# Patient Record
Sex: Female | Born: 1935 | Race: Black or African American | Hispanic: No | Marital: Married | Smoking: Never smoker
Health system: Southern US, Community
[De-identification: ages and names within clinical notes are randomized; demographics above are authoritative.]

## PROBLEM LIST (undated history)

## (undated) DIAGNOSIS — F0390 Unspecified dementia without behavioral disturbance: Secondary | ICD-10-CM

## (undated) DIAGNOSIS — E119 Type 2 diabetes mellitus without complications: Secondary | ICD-10-CM

## (undated) DIAGNOSIS — I509 Heart failure, unspecified: Secondary | ICD-10-CM

## (undated) DIAGNOSIS — I10 Essential (primary) hypertension: Secondary | ICD-10-CM

---

## 2005-01-06 ENCOUNTER — Emergency Department (HOSPITAL_COMMUNITY): Admission: EM | Admit: 2005-01-06 | Discharge: 2005-01-06 | Payer: Self-pay | Admitting: Emergency Medicine

## 2007-02-06 ENCOUNTER — Ambulatory Visit: Payer: Self-pay | Admitting: Physical Medicine & Rehabilitation

## 2007-02-06 ENCOUNTER — Encounter
Admission: RE | Admit: 2007-02-06 | Discharge: 2007-05-07 | Payer: Self-pay | Admitting: Physical Medicine & Rehabilitation

## 2007-03-29 ENCOUNTER — Emergency Department (HOSPITAL_COMMUNITY): Admission: EM | Admit: 2007-03-29 | Discharge: 2007-03-30 | Payer: Self-pay | Admitting: Emergency Medicine

## 2007-05-10 ENCOUNTER — Ambulatory Visit: Payer: Self-pay | Admitting: Physical Medicine & Rehabilitation

## 2007-05-12 ENCOUNTER — Encounter
Admission: RE | Admit: 2007-05-12 | Discharge: 2007-08-10 | Payer: Self-pay | Admitting: Physical Medicine & Rehabilitation

## 2007-09-05 ENCOUNTER — Encounter
Admission: RE | Admit: 2007-09-05 | Discharge: 2007-09-06 | Payer: Self-pay | Admitting: Physical Medicine & Rehabilitation

## 2007-09-05 ENCOUNTER — Ambulatory Visit: Payer: Self-pay | Admitting: Physical Medicine & Rehabilitation

## 2008-01-02 ENCOUNTER — Encounter
Admission: RE | Admit: 2008-01-02 | Discharge: 2008-03-11 | Payer: Self-pay | Admitting: Physical Medicine & Rehabilitation

## 2008-01-02 ENCOUNTER — Ambulatory Visit: Payer: Self-pay | Admitting: Physical Medicine & Rehabilitation

## 2008-03-25 ENCOUNTER — Encounter
Admission: RE | Admit: 2008-03-25 | Discharge: 2008-03-25 | Payer: Self-pay | Admitting: Physical Medicine & Rehabilitation

## 2008-06-06 ENCOUNTER — Encounter
Admission: RE | Admit: 2008-06-06 | Discharge: 2008-06-07 | Payer: Self-pay | Admitting: Physical Medicine & Rehabilitation

## 2008-06-07 ENCOUNTER — Ambulatory Visit: Payer: Self-pay | Admitting: Physical Medicine & Rehabilitation

## 2008-09-24 ENCOUNTER — Encounter
Admission: RE | Admit: 2008-09-24 | Discharge: 2008-09-24 | Payer: Self-pay | Admitting: Physical Medicine & Rehabilitation

## 2011-05-11 NOTE — Assessment & Plan Note (Signed)
SUBJECTIVE:  The patient returns to clinic today for followup evaluation  accompanied by her daughter. Overall, the patient reports that she is  doing fairly well. She has had a recent refill on her Percocet which she  uses generally 4 tablets per day. She does need a refill on her fentanyl  patch. We have increased her probably a few months ago up to the 75  mcg/h from the 50 mcg. She reports that overall she is getting a fair  amount of relief. She is mostly wheelchair dependent although she does  occasionally use a walker insider her home.   MEDICATIONS:  1. Naproxen 250 mg b.i.d.  2. Prednisone 10 mg.  3. Hydralazine 10 mg t.i.d.  4. Oxybutynin 10 mg.  5. Fosinopril 40 mg 2 tablets.  6. Glimepiride 2 mg daily.  7. Lorazepam 1 mg b.i.d.  8. Aspirin 325 mg daily.  9. Neurontin 300 mg 2 tablets daily.  10.Fentanyl patch 75 mcg/h, change q. 72 h.  11.Lipitor 40 mg 2 tablets daily.  12.Stool softener daily.  13.Percocet 5/325 1 tablet q.i.d. p.r.n.  14.Antidepressant medication daily.   REVIEW OF SYSTEMS:  Positive for fever and chills, night sweats, high  blood sugar and limb swelling.   PHYSICAL EXAMINATION:  GENERAL APPEARANCE: Reasonably well-appearing,  overweight adult female seated in a manual wheelchair.  VITAL SIGNS: Blood pressure is 112/67 with a pulse of 73, respiratory  rate 18 and 02 saturation 98% on room air.  EXTREMITIES: She has 4-/5 strength throughout the bilateral upper and  lower extremities.   IMPRESSION:  1. Diffuse musculoskeletal pain related to osteoarthritis with      evidence of severe degenerative changes of the bilateral shoulders.  2. In the office today we did refill the patient's  fentanyl patch at      75 mcg/h strength. No refill on other medicines is necessary in the      office today. She recently had a refill at the end of August for      the Percocet.  3. Will plan on seeing her in followup in approximately 3 to 4 months      with  refills prior to that appointment as necessary.           ______________________________  Ellwood Dense, M.D.     DC/MedQ  D:  09/06/2007 10:27:44  T:  09/06/2007 20:23:00  Job #:  [id number]

## 2011-05-11 NOTE — Assessment & Plan Note (Signed)
Ms. Appleman returns to clinic today for followup evaluation.  I last saw  her in this office in January 03, 2008.  Subsequent to that, she did not  keep her appointment on March 27, 2008.  She apparently is out of her  fentanyl patch at this time and no call for refill was made.  She just  is running out of her morphine sulfate immediate release as of today.  She does need help remembering to call in for refills and I have  expressed that to her niece in the office today.   MEDICATIONS:  1. Naproxen 250 mg b.i.d.  2. Prednisone 10 mg daily.  3. Hydralazine 10 mg t.i.d.  4. Oxybutynin 10 mg daily.  5. Lisinopril 40 mg 2 tablets daily.  6. Glimepiride 2 mg daily.  7. Clonazepam 1 mg b.i.d.  8. Aspirin 325 mg daily.  9. Neurontin 300 mg 2 tablets daily.  10.Fentanyl patch 100 mcg per hour, change q.72 h.  11.Lipitor 40 mg 2 tablets daily.  12.Stool softener daily.  13.Morphine sulfate immediate release 15 mg b.i.d.  14.Antidepressant medication daily.   REVIEW OF SYSTEMS:  Positive for night sweats, skin rash, high blood  sugar, constipation, nausea, painful urination, coughing, wheezing, and  shortness of breath.   PHYSICAL EXAMINATION:  Ill-kempt overweight adult female, seated in a  manual wheelchair.  Blood pressure is 139/51 with a pulse 76, respiratory rate 20, and O2  saturation 95% on room air.  She is 4-/5 strength throughout the bilateral upper and lower  extremities.   IMPRESSION:  Diffuse musculoskeletal pain related to osteoarthritis with  evidence of severe degenerative changes over the bilateral shoulders.   In the office today, we did refill the patient's fentanyl patch at 100  mcg per hour, change q.72 h.  We have also refilled her morphine sulfate  at a b.i.d. dosing on an as needed basis.  We plan on seeing her to  follow up in approximately 3 months' time.  I have encouraged her niece  to try to help her keep control of her medication and call in for  refills  as necessary and appropriate.           ______________________________  Ellwood Dense, M.D.     DC/MedQ  D:  06/07/2008 11:03:41  T:  06/08/2008 01:03:09  Job #:  [id number]

## 2011-05-11 NOTE — Assessment & Plan Note (Signed)
FOLLOWUP OFFICE NOTE:  Ms. Sobieski returned to clinic today accompanied  by her daughter.  Patient reportedly was hospitalized for depression and  started on antidepressant medication.  The daughter feels that that has  helped substantially with her overall mood and may even have helped with  her pain.  She is using a Fentanyl patch applied by a family member.  She also uses Percocet.  During her last clinic visit we had increased  her Fentanyl up to 75 mcg patch per hour from a 50 mcg patch.  She also  was increased for the Percocet from t.i.d. to q.i.d. dosing.  Both seem  to have been changes that have helped her.   MEDICATIONS:  1. Naproxen 250 mg b.i.d.  2. Prednisone 10 mg daily.  3. Hydralazine 10 mg t.i.d.  4. Oxybutynin 10 mg daily.  5. Fosinopril 40 mg two tablets a day.  6. Glimepiride 2 mg daily.  7. Lorazepam 1 mg b.i.d.  8. Aspirin 325 mg daily.  9. Neurontin 300 mg four tablets q.i.d.  10.Fentanyl patch 75 mcg per hour, change q.72h.  11.Lipitor 40 mg two tablets daily.  12.Stool softener daily.  13.Percocet 5/325 one tablet q.i.d. p.r.n.  14.Antidepressant medication daily.   REVIEW OF SYSTEMS:  Noncontributory.   PHYSICAL EXAMINATION:  GENERAL:  Well-appearing, overweight adult female  sitting in a manual wheelchair.  VITAL SIGNS:  Blood pressure 141/76, pulse 73, respiratory rate 16, O2  saturation 99% on room air.  She has 4-/5 strength throughout the  bilateral upper and lower extremities.   IMPRESSION:  1. Diffuse musculoskeletal pain related to osteoarthritis with      evidence of severe degenerative changes of bilateral shoulders.   In the office today we did refill the patient's Percocet and Fentanyl  patch as noted above.  Will plan on seeing her in followup in  approximately four month's time with refills prior to that appointment  if necessary.  She continues to do well overall and is experiencing good  analgesic effect from the medication without  significant side effects.           ______________________________  Ellwood Dense, M.D.     DC/MedQ  D:  05/12/2007 11:20:14  T:  05/12/2007 12:28:49  Job #:  [id number]

## 2011-05-11 NOTE — Assessment & Plan Note (Signed)
Ms. Powell returns to the clinic today accompanied by her niece.  Overall, she is reporting that she is still not getting adequate relief  from the Fentanyl patch 75 mcg per hour change q.72h.  She reports that  she also uses Percocet 1 tablet 4 times a day and wants to try something  else in place of the Percocet, as she still feels that she is not  getting adequate relief.  She still complains of pain that is worse in  her shoulders, arms, and hands.   MEDICATIONS:  1. Naproxen 250 mg b.i.d.  2. Prednisone 10 mg daily.  3. Hydralazine 10 mg t.i.d.  4. Oxybutynin 10 mg daily.  5. Fonisopril 40 mg 2 tablets daily.  6. Glimepiride 2 mg daily.  7. Clonazepam 1 mg b.i.d.  8. Aspirin 325 mg daily.  9. Neurontin 300 mg 2 tablets daily.  10.Fentanyl patch 75 mcg per hour changed q.72h.  11.Lipitor 40 mg 2 tablets daily.  12.Stool softener daily.  13.Percocet 5/325 one tablet q.i.d. p.r.n. (4 per day).  14.Antidepressant medication daily.   REVIEW OF SYSTEMS:  Positive for shortness of breath, limb swelling,  poor appetite, abdominal pain, diarrhea, constipation, vomiting, nausea,  high blood sugars, night sweats, fevers, and chills.   PHYSICAL EXAM:  Reasonably well-appearing overweight adult female seated  in a manual wheelchair.  Blood pressure 143/59 with a pulse of 80, respiratory rate 20, and O2  saturation 98% on room air.  She has 4-/5 strength throughout the bilateral upper and lower  extremities.   IMPRESSION:  Diffuse musculoskeletal pain related to osteoarthritis with  evidence of severe degenerative changes at the bilateral shoulders.   In the office today, we did refill the patient's Fentanyl and increased  it up to 100 mcg per hour patch.  She will continue to change it q.72h.  We started her on morphine sulfate immediate release 15 mg b.i.d. in  place of her Percocet.  She understands that she needs to stop the  Percocet and try the morphine sulfate immediate  release for her as  needed breakthrough pain medicine.  We will plan on seeing the patient  in followup in approximately 2 to 3 months' time with refills prior to  that appointment as necessary.           ______________________________  Ellwood Dense, M.D.     DC/MedQ  D:  01/03/2008 11:57:10  T:  01/03/2008 13:12:35  Job #:  [id number]

## 2011-05-14 NOTE — Assessment & Plan Note (Signed)
Carmen Gilbert returns to clinic today accompanied by her daughter.  The  patient was first and last seen in this office on February 08, 2007 for  evaluation of diffuse chronic joint pain.  At that time we had restarted  her fentanyl patch at 50 mcg per hour, change every 72 hours.  We also  gave her a prescription for Percocet to be used t.i.d. p.r.n. and  Robaxin to be used t.i.d. p.r.n.   Her daughter and her report that she is not getting much relief with the  medicines as noted above.  They apparently have a full box of the  fentanyl patches remaining and it is unclear if she actually has been  using them on a regular basis.  She reports that occasionally they do  fall off and it is not clear if she puts a new one on or goes without  the medicine.  Her daughter tries to fill the pill box for her but for  the most part the patient takes her medicines on her own.  There is no  one that apparently keeps an eye on her in terms of her fentanyl patch.  That needs to be watched more carefully if we are going to continue to  use that medication.  The patient reports that she is not sure if she  gets any more than minimal relief from her pain medicines.   MEDICATIONS:  1. Naproxen 250 mg b.i.d.  2. Prednisone 10 mg daily.  3. Hydralazine 10 mg t.i.d.  4. Oxybutynin 10 mg daily.  5. Fosinopril 40 mg two tablets daily.  6. Glimepiride 2 mg daily.  7. Lorazepam 1 mg b.i.d.  8. Aspirin 325 mg daily.  9. Neurontin 300 mg four tablets q.i.d.  10.Fentanyl 50 mcg per hour patch, change every 72 hours.  11.Lipitor 40 mg two tablets daily.  12.Stool softener daily.  13.Percocet 5/325 one tablet t.i.d. p.r.n.  14.Robaxin 500 mg t.i.d. p.r.n.   REVIEW OF SYSTEMS:  Noncontributory.   PHYSICAL EXAMINATION:  Reasonably well-appearing, overweight, adult  female seated in a manual wheelchair.  Blood pressure is 155/60 with  pulse 64, respiratory rate 16, and O2 sat 96% room air.  She has 4-/5  strength throughout the bilateral upper and lower extremities.  Bulk and  tone were normal.   IMPRESSION:  Diffuse musculoskeletal pain related to osteoarthritis with  evidence of severe degenerative changes of the bilateral shoulders.   In the office today we did refill the patient's Percocet and I allowed  her to increase to four tablets per day from three tablets per day  p.r.n.  We have also increased her fentanyl patch up to 75 mcg per hour,  change every 72 hours.  I have asked her family to make sure that she  has that patch on every day and that it is adhering well.  She needs to  have that changed every third day, i.e., every 72 hours.  We will see  how she does with this medication and  hopefully the family can keep a closer eye on her medicines to help her.  We will plan on seeing her in followup in approximately two months'  time.           ______________________________  Ellwood Dense, M.D.     DC/MedQ  D:  03/08/2007 10:47:01  T:  03/10/2007 01:03:51  Job #:  [id number]

## 2011-05-14 NOTE — Group Therapy Note (Signed)
REFERRAL:  Dr. Lois Huxley at [facility].   PURPOSE OF EVALUATION:  Evaluate and treat diffuse joint pain.   HISTORY OF PRESENT ILLNESS:  Ms. Hellickson is a 77 year old adult female,  referred to this office by her primary care physician, Dr. Earlene Plater, for  evaluation and treatment of chronic diffuse musculoskeletal pain.  Patient is accompanied into the office today by her niece.  Minimal  medical records accompany the patient, and thus were reviewed prior to  this office visit, and again briefly with the patient in the office  today.   It does appear that the patient has had a history of diffuse  degenerative joint disease related to osteoarthritis for at least 10  years duration.  She is a rather poor historian.  She apparently had a  CAT scan of her right hip done December 2006, which showed 2 benign  cystic sclerotic lesions of the proximal right femur.  Patient was seen  by Dr. Earlene Plater through most of 2007, and specifically August 15, 2006 was  changed to Percocet 5/325 two tablets t.i.d. p.r.n. from Lortab.   On October 15, 2006, the patient had home health physical therapy  ordered.   On October 17, 2006, a plane film of the right shoulder showed very  severe degenerative changes of the right shoulder with subluxation of  the humeral head.  The left shoulder film showed mild-to-moderate  diffuse degenerative changes.   On October 01, 2006, the patient saw Dr. Earlene Plater in followup.  At that  time, her Neurontin was increased to 600 mg t.i.d., and her Percocet was  continued at 5/325 one to two tablets p.o. t.i.d. p.r.n.   On December 14, 2006, the patient saw Dr. Earlene Plater in followup and fentanyl  was started at 25 mcg per hour, change q.72 h.  She subsequently called  back reporting that the patches were not working.  It does appear that  at some point the Duragesic patch was increased to 50 mcg, although I am  unsure exactly when that was done.   On January 06, 2007,  patient's sister and the patient were reportedly  selling pain medicines to a Mr. Orson Ape.  Dr. Earlene Plater became aware  of this, and put the patient on a weaning Percocet schedule and referred  her to this office.  He reported that she would not be given further  narcotic medications through that office.   The patient does report that she gave pain medicines to Mr. Judeth Horn.  She reports that she is not planning to do that in the future.  She  reports that she did not sell the medication, but specifically did give  him the medication.  She is taking the Percocet on a weaning schedule  now, and takes one only occasionally.  She still uses the fentanyl  patch, and is due to put her last patch on today.  That strength is 50  mcg per hour.   The patient is a poor historian, and complains of pain throughout most  of her joints, especially her arms, her wrists, elbows, shoulders, low  back, knees, and hips and ankles.  She reports that her blood sugars  have been in the 141-251 range.  She reports that none of the pain  medicines give her complete relief at the present time.  She is not sure  if she gets any relief from the fentanyl patch or the Percocet  medications.  She basically lives alone, and has her pain  medicines  handed to her by other family members, specifically nieces and  neighbors.   PAST MEDICAL HISTORY:  1. Hypertension.  2. Diabetic neuropathy.  3. Type 2 diabetes mellitus.   ALLERGIES:  NO KNOWN DRUG ALLERGIES.   SOCIAL HISTORY:  The patient lives alone with multiple family members  checking on her, and passing medicines to her.  She reportedly can walk  only a few steps and use the wheelchair for most activity.  She also  needs help with bathing and dressing, and is unable to dress herself  without substantial help.  She has no children and is a widow.  She does  not use alcohol or tobacco.   REVIEW OF SYSTEMS:  Positive for fever and chills, weight gain,  night  sweats, skin rash, high blood sugars, constipation, nausea, poor  appetite, abdominal pain, limb swelling, shortness of breath.   MEDICATIONS:  1. Naproxen 250 mg b.i.d.  2. Prednisone 10 mg daily.  3. Hydralazine 10 mg t.i.d.  4. Oxybutynin 10 mg daily.  5. Fosinopril 40 mg 2 tablets daily.  6. Glimepiride 2 mg 1 tablet daily.  7. Lorazepam 1 mg b.i.d.  8. Aspirin 325 mg daily.  9. Gabapentin 300 mg 4 tablets at q.i.d.  10.Fentanyl 50 mcg per hour, change q.72 h.  11.Lipitor 40 mg 2 tablets daily.  12.Stool softener daily.  13.Oxy IR 5/325 one tablet t.i.d. p.r.n. (0-2 per day).   PHYSICAL EXAMINATION:  Overweight, elderly, adult female seated in a  manual wheelchair.  She is in moderate acute discomfort.  Blood pressure is 153/45 with a pulse of 90, respiratory rate 16, and O2  saturation 95% on room air.  She has 4-/5 strength in the bilateral upper extremities, with  substantially decreased range of motion of her right shoulder.  She is  unable to come even to 90 degrees in shoulder flexion or abduction.  She  complains of pain in her arms with elbow passive range of motion or  wrist passive range of motion.  Bulk and tone were normal.  Sensation  was intact to light touch with bilateral upper extremities.  Lower  extremity exam was limited as she was unable to stand or walk.  She had  4-/5 strength in hip flexion, and 4/5 strength in the extension.  Sensation was intact to light touch with bilateral extremities.   IMPRESSION:  1. Diffuse musculoskeletal pain related to osteoarthritis with      evidence of severe degenerative changes of bilateral shoulders.  2. In the office today, we did continue the patient on her fentanyl      patch 50 mcg per hour, change q.72 h.  We also continued the      naproxen.  We added Robaxin 500 mg 1 tablet t.i.d. p.r.n., along      with continuation of the Percocet 5/325 one to two tablets p.o.     t.i.d. p.r.n.  She understands that  she needs to get all pain      medicines only through this office.  Will be seeing her at followup      in approximately 1 month's time and making adjustments as necessary      at that point.  I have tried to explain to her that I cannot take      away all of the pain, and need to adjust her medicines gradually to      avoid over sedation.  We will obtain a urine drug screen in  the      office today.  She understands that if there is any selling or      giving of pain medicines to other individuals that she will be      released immediately from this office.           ______________________________  Ellwood Dense, M.D.    DC/MedQ  D:  02/08/2007 10:38:02  T:  02/08/2007 11:30:32  Job #:  [id number]

## 2017-08-01 ENCOUNTER — Encounter (HOSPITAL_COMMUNITY): Payer: Self-pay

## 2017-08-01 ENCOUNTER — Emergency Department (HOSPITAL_COMMUNITY): Payer: Medicare Other

## 2017-08-01 ENCOUNTER — Encounter (HOSPITAL_COMMUNITY)
Admission: EM | Disposition: A | Discharge status: Skilled Nursing Facility | Payer: Self-pay | Source: Home / Self Care | Attending: Cardiology

## 2017-08-01 ENCOUNTER — Inpatient Hospital Stay (HOSPITAL_COMMUNITY)
Admission: EM | Admit: 2017-08-01 | Discharge: 2017-08-03 | DRG: 244 | Disposition: A | Discharge status: Skilled Nursing Facility | Payer: Medicare Other | Attending: Internal Medicine | Admitting: Internal Medicine

## 2017-08-01 DIAGNOSIS — I509 Heart failure, unspecified: Secondary | ICD-10-CM | POA: Diagnosis not present

## 2017-08-01 DIAGNOSIS — I110 Hypertensive heart disease with heart failure: Secondary | ICD-10-CM | POA: Diagnosis not present

## 2017-08-01 DIAGNOSIS — E119 Type 2 diabetes mellitus without complications: Secondary | ICD-10-CM | POA: Diagnosis not present

## 2017-08-01 DIAGNOSIS — F0390 Unspecified dementia without behavioral disturbance: Secondary | ICD-10-CM | POA: Diagnosis present

## 2017-08-01 DIAGNOSIS — R55 Syncope and collapse: Secondary | ICD-10-CM

## 2017-08-01 DIAGNOSIS — Z66 Do not resuscitate: Secondary | ICD-10-CM | POA: Diagnosis present

## 2017-08-01 DIAGNOSIS — N179 Acute kidney failure, unspecified: Secondary | ICD-10-CM | POA: Diagnosis not present

## 2017-08-01 DIAGNOSIS — I442 Atrioventricular block, complete: Principal | ICD-10-CM | POA: Diagnosis present

## 2017-08-01 DIAGNOSIS — Z789 Other specified health status: Secondary | ICD-10-CM

## 2017-08-01 DIAGNOSIS — I10 Essential (primary) hypertension: Secondary | ICD-10-CM | POA: Diagnosis not present

## 2017-08-01 DIAGNOSIS — Z7982 Long term (current) use of aspirin: Secondary | ICD-10-CM | POA: Diagnosis not present

## 2017-08-01 DIAGNOSIS — Z885 Allergy status to narcotic agent status: Secondary | ICD-10-CM

## 2017-08-01 DIAGNOSIS — Z959 Presence of cardiac and vascular implant and graft, unspecified: Secondary | ICD-10-CM

## 2017-08-01 DIAGNOSIS — I361 Nonrheumatic tricuspid (valve) insufficiency: Secondary | ICD-10-CM | POA: Diagnosis not present

## 2017-08-01 HISTORY — DX: Type 2 diabetes mellitus without complications: E11.9

## 2017-08-01 HISTORY — DX: Heart failure, unspecified: I50.9

## 2017-08-01 HISTORY — DX: Essential (primary) hypertension: I10

## 2017-08-01 HISTORY — PX: TEMPORARY PACEMAKER: CATH118268

## 2017-08-01 HISTORY — DX: Unspecified dementia, unspecified severity, without behavioral disturbance, psychotic disturbance, mood disturbance, and anxiety: F03.90

## 2017-08-01 LAB — CBC WITH DIFFERENTIAL/PLATELET
BASOS ABS: 0 10*3/uL (ref 0.0–0.1)
BASOS PCT: 1 %
EOS ABS: 0.2 10*3/uL (ref 0.0–0.7)
EOS PCT: 3 %
HEMATOCRIT: 25.9 % — AB (ref 36.0–46.0)
Hemoglobin: 8.2 g/dL — ABNORMAL LOW (ref 12.0–15.0)
Lymphocytes Relative: 49 %
Lymphs Abs: 3.3 10*3/uL (ref 0.7–4.0)
MCH: 28 pg (ref 26.0–34.0)
MCHC: 31.7 g/dL (ref 30.0–36.0)
MCV: 88.4 fL (ref 78.0–100.0)
MONO ABS: 0.7 10*3/uL (ref 0.1–1.0)
MONOS PCT: 10 %
NEUTROS ABS: 2.4 10*3/uL (ref 1.7–7.7)
Neutrophils Relative %: 37 %
Platelets: 190 10*3/uL (ref 150–400)
RBC: 2.93 MIL/uL — ABNORMAL LOW (ref 3.87–5.11)
RDW: 13.5 % (ref 11.5–15.5)
WBC: 6.6 10*3/uL (ref 4.0–10.5)

## 2017-08-01 LAB — MAGNESIUM: Magnesium: 2.2 mg/dL (ref 1.7–2.4)

## 2017-08-01 LAB — COMPREHENSIVE METABOLIC PANEL
ALT: 21 U/L (ref 14–54)
AST: 23 U/L (ref 15–41)
Albumin: 3.6 g/dL (ref 3.5–5.0)
Alkaline Phosphatase: 78 U/L (ref 38–126)
Anion gap: 9 (ref 5–15)
BILIRUBIN TOTAL: 0.6 mg/dL (ref 0.3–1.2)
BUN: 67 mg/dL — AB (ref 6–20)
CALCIUM: 8.5 mg/dL — AB (ref 8.9–10.3)
CO2: 21 mmol/L — ABNORMAL LOW (ref 22–32)
CREATININE: 2.03 mg/dL — AB (ref 0.44–1.00)
Chloride: 100 mmol/L — ABNORMAL LOW (ref 101–111)
GFR calc Af Amer: 24 mL/min — ABNORMAL LOW (ref >60)
GFR, EST NON AFRICAN AMERICAN: 21 mL/min — AB (ref >60)
Glucose, Bld: 105 mg/dL — ABNORMAL HIGH (ref 65–99)
Potassium: 4.4 mmol/L (ref 3.5–5.1)
Sodium: 130 mmol/L — ABNORMAL LOW (ref 135–145)
TOTAL PROTEIN: 6.8 g/dL (ref 6.5–8.1)

## 2017-08-01 LAB — I-STAT CG4 LACTIC ACID, ED: Lactic Acid, Venous: 1.2 mmol/L (ref 0.5–1.9)

## 2017-08-01 LAB — BRAIN NATRIURETIC PEPTIDE: B Natriuretic Peptide: 1009.3 pg/mL — ABNORMAL HIGH (ref 0.0–100.0)

## 2017-08-01 LAB — PROTIME-INR
INR: 1.03
PROTHROMBIN TIME: 13.5 s (ref 11.4–15.2)

## 2017-08-01 LAB — I-STAT TROPONIN, ED: Troponin i, poc: 0.06 ng/mL (ref 0.00–0.08)

## 2017-08-01 SURGERY — TEMPORARY PACEMAKER
Anesthesia: LOCAL

## 2017-08-01 MED ORDER — ACETAMINOPHEN 325 MG PO TABS
650.0000 mg | ORAL_TABLET | ORAL | Status: DC | PRN
  Administered 2017-08-02: 650 mg via ORAL
  Filled 2017-08-01: qty 2

## 2017-08-01 MED ORDER — HEPARIN (PORCINE) IN NACL 2-0.9 UNIT/ML-% IJ SOLN
INTRAMUSCULAR | Status: AC | PRN
  Administered 2017-08-01: 500 mL

## 2017-08-01 MED ORDER — INSULIN ASPART 100 UNIT/ML ~~LOC~~ SOLN
0.0000 [IU] | Freq: Three times a day (TID) | SUBCUTANEOUS | Status: DC
  Administered 2017-08-03: 4 [IU] via SUBCUTANEOUS

## 2017-08-01 MED ORDER — HEPARIN (PORCINE) IN NACL 2-0.9 UNIT/ML-% IJ SOLN
INTRAMUSCULAR | Status: AC
  Filled 2017-08-01: qty 500

## 2017-08-01 MED ORDER — MIDAZOLAM HCL 2 MG/2ML IJ SOLN: 0.5000 mg | Freq: Once | INTRAMUSCULAR | Status: DC

## 2017-08-01 MED ORDER — ONDANSETRON HCL 4 MG/2ML IJ SOLN: 4.0000 mg | Freq: Four times a day (QID) | INTRAMUSCULAR | Status: DC | PRN

## 2017-08-01 MED ORDER — LIDOCAINE HCL (PF) 1 % IJ SOLN
INTRAMUSCULAR | Status: AC
  Filled 2017-08-01: qty 30

## 2017-08-01 MED ORDER — LIDOCAINE HCL (PF) 1 % IJ SOLN
INTRAMUSCULAR | Status: DC | PRN
  Administered 2017-08-01: 15 mL

## 2017-08-01 SURGICAL SUPPLY — 5 items
CABLE ADAPT CONN TEMP 6FT (ADAPTER) ×2 IMPLANT
CATH S G BIP PACING (SET/KITS/TRAYS/PACK) ×2 IMPLANT
PACK CARDIAC CATHETERIZATION (CUSTOM PROCEDURE TRAY) ×2 IMPLANT
SHEATH PINNACLE 6F 10CM (SHEATH) ×2 IMPLANT
SLEEVE REPOSITIONING LENGTH 30 (MISCELLANEOUS) ×2 IMPLANT

## 2017-08-01 NOTE — ED Notes (Signed)
PAGED CARDS TO Rush LandmarkEGELER

## 2017-08-01 NOTE — ED Notes (Signed)
Pt being transcutaneously paced rate 60 at 16mA

## 2017-08-01 NOTE — ED Notes (Signed)
Cards at bedside

## 2017-08-01 NOTE — ED Notes (Signed)
Dr. Rush Landmarkegeler and myself at pt bedside at 1944 when she was a&o speaking to us and then went asystole on monitor with no palpable pulses. PT remained in asystole for about 30 seconds and then converted back to complete heart block with a rate of 28. Pt began talking to us shortly after.

## 2017-08-01 NOTE — ED Notes (Signed)
Pt arrives via EMS from [facility] with complete heart block rate 40. Pt had syncopal episode at nursing facility today. Only complaints at this time are weakness and dizziness. Pt has hx of dementia, chf, DM. Denies pain.    124/50 Hr 40 cbg 132 Pt wears 2lpm [place] at all times

## 2017-08-01 NOTE — H&P (Signed)
CARDIOLOGY HISTORY AND PHYSICAL     Date: 08/01/2017 Admitting Physician: No admitting provider for patient encounter.  Chief Complaint:  Syncope ____________________________________________________________________ History of Present Illness:  Carmen Gilbert is a 83 y.o. old female with medical history noted below presents with complaints of syncope.  She's had periods of lightheadedness and dizziness over the last couple of weeks while at the nursing home, however, today she had an episode of syncope.  She is unsure of how long she was out.  She denies significant history of cardiac issues, however, her nursing home notes state a history of congestive heart failure.  Unsure of what her heart function is currently.  On arrival her ECG demonstrated complete heart block with a ventricular escape rhythm in the 30s.  Per the ED soon after arrival, her ventricular escape rhythm stopped and she had ventricular stand still for approximately 20 seconds before return.  At that point they attempted to transcutaneously pace her.  On the monitor, it is unlikely she has ventricular capture as her ventricular escape complex continue with a external pacing rate of 60.  She is currently hemodynamically stable and talking.  Her only rate controlling agent is Coreg 12.5 mg BID.  She is currently on ASA 81 for primary prevention.  Recent labs with Cr of 1.97 and H/H of 8.8/26.8.    She is currently DNR/DNI however, she realizes she will need to hold this order for the temp wire as well as a possible permanent device.  She would like to proceed.    Review of Systems:   Review of Systems:  GEN: no fever, chills, nausea, vomiting, weight change  HEENT: no vision or hearing changes  PULM: no coughing, SOB  CV: no chest pain, palpitations, PND, orthopnea, +syncope GI: no abdominal pain  GU: no dysuria  EXT: no swelling  SKIN: no rashes  NEURO: no numbness or tingling, +lightheadedness and dizziness HEME: no bleeding  or bruising  GYN: none  --12 point review systems- otherwise negative.  All other systems reviewed and are negative  Past Medical History:  Diagnosis Date  . CHF (congestive heart failure) (HCC)   . Dementia   . Diabetes mellitus without complication (HCC)   . Hypertension     No past surgical history on file.  Social History   Social History  . Marital status: Married    Spouse name: N/A  . Number of children: N/A  . Years of education: N/A   Occupational History  . Not on file.   Social History Main Topics  . Smoking status: Never Smoker  . Smokeless tobacco: Never Used  . Alcohol use No  . Drug use: No  . Sexual activity: Not on file   Other Topics Concern  . Not on file   Social History Narrative  . No narrative on file    No family history on file.  Past Cardiovascular History:  - No documented h/o CAD - No documented h/o MI +CHF - No documented h/o PVD - No documented h/o AAA - No documented h/o valvular heart disease - No documented h/o CVA - No documented h/o Arrhythmias - No documented h/o A-fib  - No documented h/o congenital heart disease - No documented h/o CABG - No documented h/o PCI - No documented h/o cardiac devices (Pacer/ICD/CRT) - No documented h/o cardiac surgery       Most recent stress test:  None  Most recent echocardiography:  None  Most recent left heart catheterization:  None  CABG:  Date/ Physician: None  Device history:  None  Prior to Admission medications   Not on File    Allergies  Allergen Reactions  . Ms Contin [Morphine Sulfate Er]     Social History:   Social History  Substance Use Topics  . Smoking status: Never Smoker  . Smokeless tobacco: Never Used  . Alcohol use No    No family history on file.  Physical Examination: Blood pressure (!) 149/69, pulse (!) 59, temperature 97.9 F (36.6 C), temperature source Oral, resp. rate 18, height 5' (1.524 m), SpO2 99 %. General:  AAOX 4.  NAD.   NRD.  HENT: Normocephalic. Atraumatic.  No acute abnom. EYES: PERRL EOMI  Neck: Supple.  No JVD.  No bruits. Cardiovascular:  Nl S1. Nl S2. No S3. No S4. Nl PMI. No m/r/c. RRR  Pulmonary/Chest: CTA B. No rales. No wheezing.  Abdomen: Soft, NT, no masses, no organomegaly. Neuro: CN intact, no motor/sensory deficit.  Ext: Warm. No edema.  SKIN- intact  No intake or output data in the 24 hours ending 08/01/17 2028  Troponin (Point of Care Test) No results for input(s): TROPIPOC in the last 72 hours. ____________________________________________________________________ Assessment/Plan  Complete heart block   Assessment:  Patient presents with syncopal episode and found to be in complete heart block.  The interventional attending on call has been contacted and will place a temp pacer wire.  Needs to be seen by EP tomorrow for evaluation for permanent pacemaker implant.   Plan  -  NPO after midnight  -  No heparin or lovenox  -  Hold coreg  -  Hold ASA  Diabetes   Assessment:  Patient with history of diabetes.  Based on her medication list, I don't see she is on any glycemic agents.  Will continue to monitor.   Plan  -  Monitor glucose  -  Sliding scale insulin  Thank you for consulting cardiology.    Electronically signed by Nada Maclachlan 08/01/2017 Link Snuffer, MD, PhD Cardiology

## 2017-08-01 NOTE — ED Notes (Signed)
Pt asystole on monitor with no palpable pulse. DNR form at bedside.

## 2017-08-01 NOTE — ED Notes (Signed)
Pt placed on zoll pads at this time

## 2017-08-02 ENCOUNTER — Encounter (HOSPITAL_COMMUNITY): Payer: Self-pay | Admitting: Cardiovascular Disease

## 2017-08-02 ENCOUNTER — Encounter (HOSPITAL_COMMUNITY)
Admission: EM | Disposition: A | Discharge status: Skilled Nursing Facility | Payer: Self-pay | Source: Home / Self Care | Attending: Cardiology

## 2017-08-02 ENCOUNTER — Inpatient Hospital Stay (HOSPITAL_COMMUNITY): Payer: Medicare Other

## 2017-08-02 DIAGNOSIS — I442 Atrioventricular block, complete: Secondary | ICD-10-CM

## 2017-08-02 DIAGNOSIS — I361 Nonrheumatic tricuspid (valve) insufficiency: Secondary | ICD-10-CM

## 2017-08-02 DIAGNOSIS — I509 Heart failure, unspecified: Secondary | ICD-10-CM

## 2017-08-02 DIAGNOSIS — N179 Acute kidney failure, unspecified: Secondary | ICD-10-CM

## 2017-08-02 DIAGNOSIS — I10 Essential (primary) hypertension: Secondary | ICD-10-CM

## 2017-08-02 HISTORY — PX: PACEMAKER IMPLANT: EP1218

## 2017-08-02 LAB — ECHOCARDIOGRAM COMPLETE
Height: 60 in
Weight: 3224.01 oz

## 2017-08-02 LAB — BASIC METABOLIC PANEL
ANION GAP: 10 (ref 5–15)
BUN: 63 mg/dL — AB (ref 6–20)
CHLORIDE: 103 mmol/L (ref 101–111)
CO2: 21 mmol/L — ABNORMAL LOW (ref 22–32)
Calcium: 8.8 mg/dL — ABNORMAL LOW (ref 8.9–10.3)
Creatinine, Ser: 1.74 mg/dL — ABNORMAL HIGH (ref 0.44–1.00)
GFR, EST AFRICAN AMERICAN: 29 mL/min — AB (ref >60)
GFR, EST NON AFRICAN AMERICAN: 25 mL/min — AB (ref >60)
Glucose, Bld: 97 mg/dL (ref 65–99)
POTASSIUM: 4.2 mmol/L (ref 3.5–5.1)
SODIUM: 134 mmol/L — AB (ref 135–145)

## 2017-08-02 LAB — GLUCOSE, CAPILLARY
GLUCOSE-CAPILLARY: 111 mg/dL — AB (ref 65–99)
Glucose-Capillary: 89 mg/dL (ref 65–99)
Glucose-Capillary: 111 mg/dL — ABNORMAL HIGH (ref 65–99)

## 2017-08-02 LAB — CBC
HEMATOCRIT: 27.3 % — AB (ref 36.0–46.0)
Hemoglobin: 8.9 g/dL — ABNORMAL LOW (ref 12.0–15.0)
MCH: 28.6 pg (ref 26.0–34.0)
MCHC: 32.6 g/dL (ref 30.0–36.0)
MCV: 87.8 fL (ref 78.0–100.0)
Platelets: 195 10*3/uL (ref 150–400)
RBC: 3.11 MIL/uL — AB (ref 3.87–5.11)
RDW: 13.3 % (ref 11.5–15.5)
WBC: 6.6 10*3/uL (ref 4.0–10.5)

## 2017-08-02 LAB — SURGICAL PCR SCREEN
MRSA, PCR: INVALID — AB
MRSA, PCR: NEGATIVE
STAPHYLOCOCCUS AUREUS: INVALID — AB
Staphylococcus aureus: NEGATIVE

## 2017-08-02 LAB — T4, FREE: Free T4: 0.95 ng/dL (ref 0.61–1.12)

## 2017-08-02 LAB — PROTIME-INR
INR: 1.03
PROTHROMBIN TIME: 13.5 s (ref 11.4–15.2)

## 2017-08-02 LAB — TSH: TSH: 1.598 u[IU]/mL (ref 0.350–4.500)

## 2017-08-02 SURGERY — PACEMAKER IMPLANT
Anesthesia: LOCAL

## 2017-08-02 MED ORDER — LIDOCAINE HCL (PF) 1 % IJ SOLN
INTRAMUSCULAR | Status: AC
  Filled 2017-08-02: qty 60

## 2017-08-02 MED ORDER — LIDOCAINE HCL (PF) 1 % IJ SOLN
INTRAMUSCULAR | Status: AC
  Filled 2017-08-02: qty 30

## 2017-08-02 MED ORDER — SODIUM CHLORIDE 0.9 % IR SOLN
80.0000 mg | Status: AC
  Administered 2017-08-02: 80 mg

## 2017-08-02 MED ORDER — SODIUM CHLORIDE 0.9% FLUSH: 3.0000 mL | INTRAVENOUS | Status: DC | PRN

## 2017-08-02 MED ORDER — SODIUM CHLORIDE 0.9 % IV SOLN
INTRAVENOUS | Status: DC
  Administered 2017-08-02: 12:00:00 via INTRAVENOUS

## 2017-08-02 MED ORDER — CEFAZOLIN SODIUM-DEXTROSE 2-4 GM/100ML-% IV SOLN
2.0000 g | INTRAVENOUS | Status: AC
Start: 2017-08-02 — End: 2017-08-02
  Administered 2017-08-02: 2 g via INTRAVENOUS

## 2017-08-02 MED ORDER — CEFAZOLIN SODIUM-DEXTROSE 1-4 GM/50ML-% IV SOLN
1.0000 g | Freq: Four times a day (QID) | INTRAVENOUS | Status: AC
  Administered 2017-08-02 – 2017-08-03 (×3): 1 g via INTRAVENOUS
  Filled 2017-08-02 (×4): qty 50

## 2017-08-02 MED ORDER — CHLORHEXIDINE GLUCONATE 4 % EX LIQD
60.0000 mL | Freq: Once | CUTANEOUS | Status: DC
  Filled 2017-08-02: qty 60

## 2017-08-02 MED ORDER — AMLODIPINE BESYLATE 5 MG PO TABS
5.0000 mg | ORAL_TABLET | Freq: Every day | ORAL | Status: DC
  Administered 2017-08-02 – 2017-08-03 (×2): 5 mg via ORAL
  Filled 2017-08-02 (×2): qty 1

## 2017-08-02 MED ORDER — IOPAMIDOL (ISOVUE-370) INJECTION 76%
INTRAVENOUS | Status: DC | PRN
  Administered 2017-08-02: 15 mL via INTRAVENOUS

## 2017-08-02 MED ORDER — SODIUM CHLORIDE 0.9 % IV SOLN: 250.0000 mL | INTRAVENOUS | Status: DC | PRN

## 2017-08-02 MED ORDER — CHLORHEXIDINE GLUCONATE 4 % EX LIQD
60.0000 mL | Freq: Once | CUTANEOUS | Status: AC
  Administered 2017-08-02: 4 via TOPICAL

## 2017-08-02 MED ORDER — SODIUM CHLORIDE 0.9 % IV SOLN: INTRAVENOUS | Status: DC

## 2017-08-02 MED ORDER — MIDAZOLAM HCL 5 MG/5ML IJ SOLN
INTRAMUSCULAR | Status: AC
  Filled 2017-08-02: qty 5

## 2017-08-02 MED ORDER — IOPAMIDOL (ISOVUE-370) INJECTION 76%
INTRAVENOUS | Status: AC
  Filled 2017-08-02: qty 50

## 2017-08-02 MED ORDER — FENTANYL CITRATE (PF) 100 MCG/2ML IJ SOLN
INTRAMUSCULAR | Status: AC
  Filled 2017-08-02: qty 2

## 2017-08-02 MED ORDER — SODIUM CHLORIDE 0.9% FLUSH
3.0000 mL | Freq: Two times a day (BID) | INTRAVENOUS | Status: DC
  Administered 2017-08-02 – 2017-08-03 (×2): 3 mL via INTRAVENOUS

## 2017-08-02 MED ORDER — ACETAMINOPHEN 325 MG PO TABS
325.0000 mg | ORAL_TABLET | ORAL | Status: DC | PRN
  Administered 2017-08-02: 325 mg via ORAL
  Administered 2017-08-03 (×3): 650 mg via ORAL
  Filled 2017-08-02 (×4): qty 2

## 2017-08-02 MED ORDER — SODIUM CHLORIDE 0.9 % IR SOLN
Status: AC
  Filled 2017-08-02: qty 2

## 2017-08-02 MED ORDER — ONDANSETRON HCL 4 MG/2ML IJ SOLN: 4.0000 mg | Freq: Four times a day (QID) | INTRAMUSCULAR | Status: DC | PRN

## 2017-08-02 MED ORDER — CEFAZOLIN SODIUM-DEXTROSE 2-4 GM/100ML-% IV SOLN
INTRAVENOUS | Status: AC
  Filled 2017-08-02: qty 100

## 2017-08-02 MED ORDER — HEPARIN (PORCINE) IN NACL 2-0.9 UNIT/ML-% IJ SOLN
INTRAMUSCULAR | Status: AC | PRN
  Administered 2017-08-02: 500 mL

## 2017-08-02 MED ORDER — LIDOCAINE HCL (PF) 1 % IJ SOLN
INTRAMUSCULAR | Status: DC | PRN
  Administered 2017-08-02: 45 mL via INTRADERMAL

## 2017-08-02 SURGICAL SUPPLY — 7 items
CABLE SURGICAL S-101-97-12 (CABLE) ×2 IMPLANT
LEAD TENDRIL MRI 46CM LPA1200M (Lead) ×2 IMPLANT
LEAD TENDRIL MRI 58CM LPA1200M (Lead) ×2 IMPLANT
PACEMAKER ASSURITY DR-RF (Pacemaker) ×2 IMPLANT
PAD DEFIB LIFELINK (PAD) ×2 IMPLANT
SHEATH CLASSIC 8F (SHEATH) ×4 IMPLANT
TRAY PACEMAKER INSERTION (PACKS) ×2 IMPLANT

## 2017-08-02 NOTE — Care Management Note (Signed)
Case Management Note  Patient Details  Name: Carmen Gilbert MRN: [id number] Date of Birth: May 14, 1929  Subjective/Objective:   [facility] , presents with Symptomatic complete heart block with syncope, s/p pacemaker implant .  CSW referral.                Action/Plan: NCM will follow along with CSW for dc needs.   Expected Discharge Date:                  Expected Discharge Plan:  Skilled Nursing Facility  In-House Referral:  Clinical Social Work  Discharge planning Services  CM Consult  Post Acute Care Choice:    Choice offered to:     DME Arranged:    DME Agency:     HH Arranged:    HH Agency:     Status of Service:  Completed, signed off  If discussed at [employer] Meetings, dates discussed:    Additional Comments:  Leone Havenaylor, Davie Sagona Clinton, RN 08/02/2017, 8:55 PM

## 2017-08-02 NOTE — Progress Notes (Signed)
Site area: Right groin, venous Site prior: Level 0 Manual Pressure applied for: 15 min Patient Status During pull: Calm, watching t.v. Post Pull site: Level 0 Post pull instructions given Post pull pulses present: Yes Dressing applied Bedrest begins at: 18:00 Comments: No complications, patient understands instructions.

## 2017-08-02 NOTE — Progress Notes (Signed)
Progress Note  Patient Name: Carmen MannRosa Varnadore Date of Encounter: 08/02/2017  Primary Cardiologist: new   Subjective   Feeling well.  Tired.  Denies chest pain or shortness of breath. Denies lightheadedness or dizziness. She reports feeling poorly for about one week prior to admission.  Inpatient Medications    Scheduled Meds: . insulin aspart  0-20 Units Subcutaneous TID WC  . midazolam  0.5 mg Intravenous Once   Continuous Infusions:  PRN Meds: acetaminophen, ondansetron (ZOFRAN) IV   Vital Signs    Vitals:   08/02/17 0600 08/02/17 0700 08/02/17 0737 08/02/17 0800  BP: (!) 144/92 (!) 155/73  138/69  Pulse: (!) 59 60  60  Resp: 16 19  18   Temp:   97.6 F (36.4 C)   TempSrc:   Oral   SpO2: 100% 100%  100%  Weight:      Height:        Intake/Output Summary (Last 24 hours) at 08/02/17 0856 Last data filed at 08/02/17 0800  Gross per 24 hour  Intake                0 ml  Output             2550 ml  Net            -2550 ml   Filed Weights   08/02/17 0300  Weight: 91.4 kg (201 lb 8 oz)    Telemetry    Complete heart block. 15.2 second ventricular pause. Currently ventricularly paced. NSVT up to 5 beats.  Atrial rhythm is sinus rhythm.  - He Personally Reviewed  ECG    08/01/17: Complete heart block. Ventricular escape rate 29 bpm.  RBBB, LAFB.  - Personally Reviewed  Physical Exam   VS:  BP 138/69 (BP Location: Right Arm)   Pulse 60   Temp 97.6 F (36.4 C) (Oral)   Resp 18   Ht 5' (1.524 m)   Wt 91.4 kg (201 lb 8 oz)   SpO2 100%   BMI 39.35 kg/m  , BMI Body mass index is 39.35 kg/m. GENERAL:  Well appearing elderly woman in no acute distress HEENT: Pupils equal round and reactive, fundi not visualized, oral mucosa unremarkable NECK:  No jugular venous distention, waveform within normal limits, carotid upstroke brisk and symmetric, no bruits LUNGS:  Clear to auscultation bilaterally on anterior exam. HEART:  RRR.  PMI not displaced or sustained,S1 and S2  within normal limits, no S3, no S4, no clicks, no rubs, no murmurs ABD:  Flat, positive bowel sounds normal in frequency in pitch, no bruits, no rebound, no guarding, no midline pulsatile mass, no hepatomegaly, no splenomegaly EXT:  2 plus pulses throughout, no edema, no cyanosis no clubbing SKIN:  No rashes no nodules NEURO:  Cranial nerves II through XII grossly intact, motor grossly intact throughout [facility]:  Cognitively intact, oriented to person place and time   Labs    Chemistry Recent Labs Lab 08/01/17 1959 08/02/17 0211  NA 130* 134*  K 4.4 4.2  CL 100* 103  CO2 21* 21*  GLUCOSE 105* 97  BUN 67* 63*  CREATININE 2.03* 1.74*  CALCIUM 8.5* 8.8*  PROT 6.8  --   ALBUMIN 3.6  --   AST 23  --   ALT 21  --   ALKPHOS 78  --   BILITOT 0.6  --   GFRNONAA 21* 25*  GFRAA 24* 29*  ANIONGAP 9 10     Hematology Recent Labs  Lab 08/01/17 1959 08/02/17 0211  WBC 6.6 6.6  RBC 2.93* 3.11*  HGB 8.2* 8.9*  HCT 25.9* 27.3*  MCV 88.4 87.8  MCH 28.0 28.6  MCHC 31.7 32.6  RDW 13.5 13.3  PLT 190 195    Cardiac EnzymesNo results for input(s): TROPONINI in the last 168 hours.  Recent Labs Lab 08/01/17 2021  TROPIPOC 0.06     BNP Recent Labs Lab 08/01/17 1959  BNP 1,009.3*     DDimer No results for input(s): DDIMER in the last 168 hours.   Radiology    Dg Chest Portable 1 View  Result Date: 08/01/2017 CLINICAL DATA:  Complete heart block. EXAM: PORTABLE CHEST 1 VIEW COMPARISON:  06/30/2011 FINDINGS: The heart is enlarged. Pacing pads present. Lung showed no gross edema or consolidation. No pneumothorax or pleural fluid identified. IMPRESSION: Cardiac enlargement.  No pulmonary edema identified. Electronically Signed   By: Irish Lack M.D.   On: 08/01/2017 20:32    Cardiac Studies   Echo pending  Patient Profile     83 y.o. female with heart failure type unknown, diabetes and dementia here with complete heart block and syncope.  Assessment & Plan    #  Complete heart block:  Patient had a documented 15.2 second pause with no ventricular escape.  She is currently being V paced at 60 bpm. Upon turning down the temp wire rate 40 she continues to be without a ventricular escape. I have consulted and they will see her today. An echocardiogram and will occur today. Her last dose of carvedilol was reportedly yesterday morning.   # Chronic heart failure, type unknown:  She is euvolemic.  Echo pending as above.   # Hypertension: BP is elevated.  Holding home antihypertensives.  # Diabetes mellitus: SSI.  AC/HS accuchecks.    # AKI: Baseline renal function unknown.  Creatinine improved from 2 to 1.74 today.   Time spent: 45 minutes-Greater than 50% of this time was spent in counseling, explanation of diagnosis, planning of further management, and coordination of care.   Signed, Chilton Si, MD  08/02/2017, 8:56 AM

## 2017-08-02 NOTE — ED Provider Notes (Signed)
[facility]-EMERGENCY DEPT Provider Note   CSN: [id number] Arrival date & time: 08/01/17  [id number]     History   Chief Complaint Chief Complaint  Patient presents with  . Bradycardia    HPI Carmen Gilbert is a 83 y.o. female.  The history is provided by the patient and medical records.  Loss of Consciousness   This is a new problem. The current episode started 6 to 12 hours ago. The problem occurs rarely. The problem has been resolved. She lost consciousness for a period of less than one minute. The problem is associated with normal activity. Associated symptoms include light-headedness and malaise/fatigue. Pertinent negatives include abdominal pain, chest pain, confusion, congestion, diaphoresis, fever, headaches, nausea, palpitations, seizures, slurred speech and vomiting. She has tried relaxation for the symptoms. The treatment provided no relief. Her past medical history is significant for HTN. Past medical history comments: CHF per paperwork.    Past Medical History:  Diagnosis Date  . CHF (congestive heart failure) (HCC)   . Dementia   . Diabetes mellitus without complication (HCC)   . Hypertension     Patient Active Problem List   Diagnosis Date Noted  . Complete heart block (HCC) 08/01/2017    No past surgical history on file.  OB History    No data available       Home Medications    Prior to Admission medications   Not on File    Family History No family history on file.  Social History Social History  Substance Use Topics  . Smoking status: Never Smoker  . Smokeless tobacco: Never Used  . Alcohol use No     Allergies   Ms contin [morphine sulfate er]   Review of Systems Review of Systems  Constitutional: Positive for fatigue and malaise/fatigue. Negative for appetite change, chills, diaphoresis and fever.  HENT: Negative for congestion.   Respiratory: Negative for cough, chest tightness, shortness of breath, wheezing and stridor.   Cardiovascular:  Positive for syncope. Negative for chest pain and palpitations.  Gastrointestinal: Negative for abdominal pain, diarrhea, nausea and vomiting.  Genitourinary: Negative for dysuria.  Skin: Negative for wound.  Neurological: Positive for syncope and light-headedness. Negative for seizures and headaches.  Psychiatric/Behavioral: Negative for confusion.  All other systems reviewed and are negative.    Physical Exam Updated Vital Signs BP (!) 146/58 (BP Location: Right Arm)   Pulse (!) 57   Temp 98.3 F (36.8 C) (Oral)   Resp 11   Ht 5' (1.524 m)   SpO2 99%   Physical Exam  Constitutional: She appears well-developed and well-nourished. No distress.  HENT:  Head: Normocephalic.  Mouth/Throat: Oropharynx is clear and moist. No oropharyngeal exudate.  Eyes: Pupils are equal, round, and reactive to light.  Neck: Normal range of motion.  Cardiovascular:  Extrasystoles are present. Bradycardia present.   PT in complete heart bloch bradycardia on arrival. Had ventricular standstill for 20 seconds during intial evaluation.    Pulmonary/Chest: Effort normal. No stridor. No respiratory distress. She has no wheezes. She exhibits no tenderness.  Abdominal: Soft. There is no tenderness.  Neurological: She is alert. No cranial nerve deficit or sensory deficit. She exhibits normal muscle tone. GCS eye subscore is 4. GCS verbal subscore is 5. GCS motor subscore is 6.  Skin: Capillary refill takes less than 2 seconds. She is not diaphoretic. No erythema.  Nursing note and vitals reviewed.    ED Treatments / Results  Labs (all labs ordered are listed, but only  abnormal results are displayed) Labs Reviewed  CBC WITH DIFFERENTIAL/PLATELET - Abnormal; Notable for the following:       Result Value   RBC 2.93 (*)    Hemoglobin 8.2 (*)    HCT 25.9 (*)    All other components within normal limits  COMPREHENSIVE METABOLIC PANEL - Abnormal; Notable for the following:    Sodium 130 (*)    Chloride  100 (*)    CO2 21 (*)    Glucose, Bld 105 (*)    BUN 67 (*)    Creatinine, Ser 2.03 (*)    Calcium 8.5 (*)    GFR calc non Af Amer 21 (*)    GFR calc Af Amer 24 (*)    All other components within normal limits  BRAIN NATRIURETIC PEPTIDE - Abnormal; Notable for the following:    B Natriuretic Peptide 1,009.3 (*)    All other components within normal limits  BASIC METABOLIC PANEL - Abnormal; Notable for the following:    Sodium 134 (*)    CO2 21 (*)    BUN 63 (*)    Creatinine, Ser 1.74 (*)    Calcium 8.8 (*)    GFR calc non Af Amer 25 (*)    GFR calc Af Amer 29 (*)    All other components within normal limits  CBC - Abnormal; Notable for the following:    RBC 3.11 (*)    Hemoglobin 8.9 (*)    HCT 27.3 (*)    All other components within normal limits  SURGICAL PCR SCREEN  PROTIME-INR  MAGNESIUM  T4, FREE  TSH  PROTIME-INR  I-STAT TROPONIN, ED  I-STAT CG4 LACTIC ACID, ED    EKG  EKG Interpretation  Date/Time:  Monday August 01 2017 19:32:55 EDT Ventricular Rate:  29 PR Interval:    QRS Duration: 198 QT Interval:  593 QTC Calculation: 412 R Axis:   -62 Text Interpretation:  Complete AV block with wide QRS complex RBBB and LAFB Complete heart block No STEMI Confirmed by Theda Belfast ([id number]) on 08/01/2017 8:14:02 PM       Radiology Dg Chest Portable 1 View  Result Date: 08/01/2017 CLINICAL DATA:  Complete heart block. EXAM: PORTABLE CHEST 1 VIEW COMPARISON:  06/30/2011 FINDINGS: The heart is enlarged. Pacing pads present. Lung showed no gross edema or consolidation. No pneumothorax or pleural fluid identified. IMPRESSION: Cardiac enlargement.  No pulmonary edema identified. Electronically Signed   By: Irish Lack M.D.   On: 08/01/2017 20:32    Procedures External pacer Date/Time: 08/02/2017 1:37 AM Performed by: Heide Scales Authorized by: Heide Scales  Consent: The procedure was performed in an emergent situation. Time out: Immediately  prior to procedure a "time out" was called to verify the correct patient, procedure, equipment, support staff and site/side marked as required. Local anesthesia used: no  Anesthesia: Local anesthesia used: no  Sedation: Patient sedated: no Patient tolerance: Patient tolerated the procedure well with no immediate complications    (including critical care time)  CRITICAL CARE Performed by: Canary Brim Gilman Olazabal Total critical care time: 60 minutes Critical care time was exclusive of separately billable procedures and treating other patients. Critical care was necessary to treat or prevent imminent or life-threatening deterioration. Critical care was time spent personally by me on the following activities: development of treatment plan with patient and/or surrogate as well as nursing, discussions with consultants, evaluation of patient's response to treatment, examination of patient, obtaining history from patient or surrogate, ordering  and performing treatments and interventions, ordering and review of laboratory studies, ordering and review of radiographic studies, pulse oximetry and re-evaluation of patient's condition.   Medications Ordered in ED Medications  midazolam (VERSED) injection 0.5 mg ( Intravenous MAR Unhold 08/01/17 2308)  acetaminophen (TYLENOL) tablet 650 mg (not administered)  ondansetron (ZOFRAN) injection 4 mg (not administered)  insulin aspart (novoLOG) injection 0-20 Units (not administered)  heparin infusion 2 units/mL in 0.9 % sodium chloride (500 mLs Other New Bag/Given 08/01/17 2135)     Initial Impression / Assessment and Plan / ED Course  I have reviewed the triage vital signs and the nursing notes.  Pertinent labs & imaging results that were available during my care of the patient were reviewed by me and considered in my medical decision making (see chart for details).     Carmen Gilbert is a 83 y.o. female With a past medical history significant for  congestive heart failure, diabetes, hypertension, and DNR status who presents for lightheadedness, fatigue, syncopal episode, and bradycardia. Patient brought in by EMS who reports that at patient's facility today, she was feeling lightheaded. She says that she was feeling bad for the last few days but had a syncopal event today. Circumstances are unknown for the syncopal episode however, patient was found to be bradycardic on facility evaluation. Patient then transferred for further evaluation.  On arrival, patient found to be bradycardic in the 20s. Initial EKG showed complete heart block. While patient was being evaluated, patient had loss of ventricular escape rhythm and patient was in PEA arrest with only atria beating.. This lasted for several seconds. As patient  had to do not resuscitate paperwork at the bedside, there was no intervention however, patient recovered on room. After recovery, patient was asked if she would want to be externally paced and she reported that she would.   Patient then externally paced with improvement in blood pressure and heart rate.   Patient continued to deny chest pain, shortness of breath, or other symptoms aside from the lightheadedness. Laboratory testing showed elevated BNP but normal lactic acid and troponin. Magnesium normal. Chest x-ray shows no pneumonia.   Cardiology quickly called.  Cardiology evaluated the patient and felt she needed a temporary pacemaker. Patient taken to Cath Lab for placement. Patient subsequently admitted to cardiology service.   Final Clinical Impressions(s) / ED Diagnoses   Final diagnoses:  Complete heart block (HCC)  Externally paced cardiac rhytm  Syncope, unspecified syncope type    Clinical Impression: 1. Complete heart block (HCC)   2. Externally paced cardiac rhytm   3. Syncope, unspecified syncope type     Disposition: Admit to cardiology after Cath lab    Carmen Gilbert, Canary Brim, MD 08/02/17 1313

## 2017-08-02 NOTE — Progress Notes (Signed)
Chaplain stopped in to visit with patient.  This consult is for an Advanced Directive.  Patient is having a procedure today and is not ready to take care of AD paperwork.  Perhaps family will be here a little later to talk about AD.  Patient may need help understanding.  MD thinks she will go back to the nursing facility tomorrow if all continues to go well.  Chaplain will remain available as needed.     08/02/17 1100  Clinical Encounter Type  Visited With Patient  Visit Type Initial;Psychological support;Spiritual support;Social support

## 2017-08-02 NOTE — Interval H&P Note (Signed)
History and Physical Interval Note:  08/02/2017 11:35 AM  Carmen Gilbert  has presented today for surgery, with the diagnosis of bradycardia  The various methods of treatment have been discussed with the patient and family. After consideration of risks, benefits and other options for treatment, the patient has consented to  Procedure(s): Pacemaker Implant (N/A) as a surgical intervention .  The patient's history has been reviewed, patient examined, no change in status, stable for surgery.  I have reviewed the patient's chart and labs.  Questions were answered to the patient's satisfaction.     Hillis RangeJames Velena Keegan

## 2017-08-02 NOTE — Progress Notes (Signed)
  Echocardiogram 2D Echocardiogram has been performed.  Leta JunglingCooper, Ahnya Akre M 08/02/2017, 9:47 AM

## 2017-08-02 NOTE — Consult Note (Addendum)
ELECTROPHYSIOLOGY CONSULT NOTE    Patient ID: Carmen MannRosa Joye MRN: [id number], DOB/AGE: [birth year] 83 y.o.  Admit date: 08/01/2017 Date of Consult: 08/02/2017  Primary Physician: Patient, No Pcp Per Primary Cardiologist: new to [facility] Electrophysiologist: Cornelious Diven (new this admission)  Patient Profile: Carmen Gilbert is a 83 y.o. female with a history of diabetes and dementia who is being seen today for the evaluation of complete heart block at the request of [facility].  HPI:  Carmen Gilbert is a 83 y.o. female with no significant past cardiac history, although the notes suggest some type of heart failure in the past. She has been on Coreg at home and took her last dose yesterday morning. She reports that for the last 2-3 days she has had weakness and dizziness. She had an episode of syncope at her SNF and EMS was called. On their arrival, she was found to be in complete heart block and transported to [facility] for further evaluation. She had recurrent ventricular standstill and underwent temporary pacemaker implant last night.  She is feeling improved this morning. She remains in complete heart block when turning external pacer down today.   She denies chest pain, palpitations, dyspnea, PND, orthopnea, nausea, vomiting, edema, weight gain, or early satiety.  Past Medical History:  Diagnosis Date  . CHF (congestive heart failure) (HCC)   . Dementia   . Diabetes mellitus without complication (HCC)   . Hypertension      Surgical History:  Past Surgical History:  Procedure Laterality Date  . TEMPORARY PACEMAKER N/A 08/01/2017   Procedure: Temporary Pacemaker;  Surgeon: Lennette BihariKelly, Thomas A, MD;  [facility];  Service: Cardiovascular;  Laterality: N/A;     No prescriptions prior to admission.    Inpatient Medications: . insulin aspart  0-20 Units Subcutaneous TID WC  . midazolam  0.5 mg Intravenous Once    Allergies:  Allergies  Allergen Reactions  . Ms Contin [Morphine Sulfate Er]      Social History   Social History  . Marital status: Married    Spouse name: N/A  . Number of children: N/A  . Years of education: N/A   Occupational History  . Not on file.   Social History Main Topics  . Smoking status: Never Smoker  . Smokeless tobacco: Never Used  . Alcohol use No  . Drug use: No  . Sexual activity: Not on file   Other Topics Concern  . Not on file   Social History Narrative  . No narrative on file     Family History: she is unaware of premature CAD, sister is alive    Review of Systems: All other systems reviewed and are otherwise negative except as noted above.  Physical Exam: Vitals:   08/02/17 0600 08/02/17 0700 08/02/17 0737 08/02/17 0800  BP: (!) 144/92 (!) 155/73  138/69  Pulse: (!) 59 60  60  Resp: 16 19  18   Temp:   97.6 F (36.4 C)   TempSrc:   Oral   SpO2: 100% 100%  100%  Weight:      Height:        GEN- The patient is elderly appearing, alert and oriented x 3 today.   HEENT: normocephalic, atraumatic; sclera clear, conjunctiva pink; hearing intact; oropharynx clear; neck supple Lungs- Clear to ausculation bilaterally, normal work of breathing.  No wheezes, rales, rhonchi Heart- Regular rate and rhythm (paced) GI- soft, non-tender, non-distended, bowel sounds present Extremities- no clubbing, cyanosis, 1+ BLE edema MS- no  significant deformity or atrophy Skin- warm and dry, no rash or lesion Psych- euthymic mood, full affect Neuro- strength and sensation are intact  Labs:  Lab Results  Component Value Date   WBC 6.6 08/02/2017   HGB 8.9 (L) 08/02/2017   HCT 27.3 (L) 08/02/2017   MCV 87.8 08/02/2017   PLT 195 08/02/2017    Recent Labs Lab 08/01/17 1959 08/02/17 0211  NA 130* 134*  K 4.4 4.2  CL 100* 103  CO2 21* 21*  BUN 67* 63*  CREATININE 2.03* 1.74*  CALCIUM 8.5* 8.8*  PROT 6.8  --   BILITOT 0.6  --   ALKPHOS 78  --   ALT 21  --   AST 23  --   GLUCOSE 105* 97      Radiology/Studies: Dg Chest  Portable 1 View  Result Date: 08/01/2017 CLINICAL DATA:  Complete heart block. EXAM: PORTABLE CHEST 1 VIEW COMPARISON:  06/30/2011 FINDINGS: The heart is enlarged. Pacing pads present. Lung showed no gross edema or consolidation. No pneumothorax or pleural fluid identified. IMPRESSION: Cardiac enlargement.  No pulmonary edema identified. Electronically Signed   By: Irish Lack M.D.   On: 08/01/2017 20:32    ZOX:WRUEA rhythm with complete heart block, ventricular rate 29, RBBB, LAFB escape (personally reviewed)  TELEMETRY: V paced at 60 (personally reviewed)  Assessment/Plan: 1.  Complete heart block The patient has symptomatic complete heart block in the setting of Coreg use She took her last dose yesterday morning Echo pending (being done now) If she remains in complete heart block this afternoon, will plan pacemaker implant. Risks, benefits reviewed with patient who wishes to proceed.  2.  HTN Elevated blood pressure this morning Will follow after pacemaker  Dr Johney Frame to see later today   Signed, Gypsy Balsam, NP 08/02/2017 8:59 AM  I have seen, examined the patient, and reviewed the above assessment and plan. On exam, RRR (paced).  V temp wire threshold is 0.70mA.  Underlying rhythm remains complete heart block with no escape despite cessation of coreg.  Changes to above are made where necessary.   The patient has symptomatic complete heart block.  No reversible causes are found.   I would therefore recommend pacemaker implantation at this time.  Risks, benefits, alternatives to pacemaker implantation were discussed in detail with the patient today. The patient understands that the risks include but are not limited to bleeding, infection, pneumothorax, perforation, tamponade, vascular damage, renal failure, MI, stroke, death,  and lead dislodgement and wishes to proceed. We will therefore schedule the procedure at the next available time.  She is planned for early afternoon.  Very  tenuous rhythm status.  At risk for further decompensation.  A high level of decision making was required for this encounter. Echo pending.  Co Sign: Hillis Range, MD 08/02/2017 11:32 AM

## 2017-08-02 NOTE — H&P (View-Only) (Signed)
ELECTROPHYSIOLOGY CONSULT NOTE    Patient ID: Carmen Gilbert MRN: [id number], DOB/AGE: [birth year] 83 y.o.  Admit date: 08/01/2017 Date of Consult: 08/02/2017  Primary Physician: Patient, No Pcp Per Primary Cardiologist: new to [facility] Electrophysiologist: Carmaleta Youngers (new this admission)  Patient Profile: Carmen Gilbert is a 83 y.o. female with a history of diabetes and dementia who is being seen today for the evaluation of complete heart block at the request of [facility].  HPI:  Carmen MannRosa Delbuono is a 83 y.o. female with no significant past cardiac history, although the notes suggest some type of heart failure in the past. She has been on Coreg at home and took her last dose yesterday morning. She reports that for the last 2-3 days she has had weakness and dizziness. She had an episode of syncope at her SNF and EMS was called. On their arrival, she was found to be in complete heart block and transported to [facility] for further evaluation. She had recurrent ventricular standstill and underwent temporary pacemaker implant last night.  She is feeling improved this morning. She remains in complete heart block when turning external pacer down today.   She denies chest pain, palpitations, dyspnea, PND, orthopnea, nausea, vomiting, edema, weight gain, or early satiety.  Past Medical History:  Diagnosis Date  . CHF (congestive heart failure) (HCC)   . Dementia   . Diabetes mellitus without complication (HCC)   . Hypertension      Surgical History:  Past Surgical History:  Procedure Laterality Date  . TEMPORARY PACEMAKER N/A 08/01/2017   Procedure: Temporary Pacemaker;  Surgeon: Lennette BihariKelly, Thomas A, MD;  [facility];  Service: Cardiovascular;  Laterality: N/A;     No prescriptions prior to admission.    Inpatient Medications: . insulin aspart  0-20 Units Subcutaneous TID WC  . midazolam  0.5 mg Intravenous Once    Allergies:  Allergies  Allergen Reactions  . Ms Contin [Morphine Sulfate Er]      Social History   Social History  . Marital status: Married    Spouse name: N/A  . Number of children: N/A  . Years of education: N/A   Occupational History  . Not on file.   Social History Main Topics  . Smoking status: Never Smoker  . Smokeless tobacco: Never Used  . Alcohol use No  . Drug use: No  . Sexual activity: Not on file   Other Topics Concern  . Not on file   Social History Narrative  . No narrative on file     Family History: she is unaware of premature CAD, sister is alive    Review of Systems: All other systems reviewed and are otherwise negative except as noted above.  Physical Exam: Vitals:   08/02/17 0600 08/02/17 0700 08/02/17 0737 08/02/17 0800  BP: (!) 144/92 (!) 155/73  138/69  Pulse: (!) 59 60  60  Resp: 16 19  18   Temp:   97.6 F (36.4 C)   TempSrc:   Oral   SpO2: 100% 100%  100%  Weight:      Height:        GEN- The patient is elderly appearing, alert and oriented x 3 today.   HEENT: normocephalic, atraumatic; sclera clear, conjunctiva pink; hearing intact; oropharynx clear; neck supple Lungs- Clear to ausculation bilaterally, normal work of breathing.  No wheezes, rales, rhonchi Heart- Regular rate and rhythm (paced) GI- soft, non-tender, non-distended, bowel sounds present Extremities- no clubbing, cyanosis, 1+ BLE edema MS- no  significant deformity or atrophy Skin- warm and dry, no rash or lesion Psych- euthymic mood, full affect Neuro- strength and sensation are intact  Labs:  Lab Results  Component Value Date   WBC 6.6 08/02/2017   HGB 8.9 (L) 08/02/2017   HCT 27.3 (L) 08/02/2017   MCV 87.8 08/02/2017   PLT 195 08/02/2017    Recent Labs Lab 08/01/17 1959 08/02/17 0211  NA 130* 134*  K 4.4 4.2  CL 100* 103  CO2 21* 21*  BUN 67* 63*  CREATININE 2.03* 1.74*  CALCIUM 8.5* 8.8*  PROT 6.8  --   BILITOT 0.6  --   ALKPHOS 78  --   ALT 21  --   AST 23  --   GLUCOSE 105* 97      Radiology/Studies: Dg Chest  Portable 1 View  Result Date: 08/01/2017 CLINICAL DATA:  Complete heart block. EXAM: PORTABLE CHEST 1 VIEW COMPARISON:  06/30/2011 FINDINGS: The heart is enlarged. Pacing pads present. Lung showed no gross edema or consolidation. No pneumothorax or pleural fluid identified. IMPRESSION: Cardiac enlargement.  No pulmonary edema identified. Electronically Signed   By: Irish Lack M.D.   On: 08/01/2017 20:32    ZOX:WRUEA rhythm with complete heart block, ventricular rate 29, RBBB, LAFB escape (personally reviewed)  TELEMETRY: V paced at 60 (personally reviewed)  Assessment/Plan: 1.  Complete heart block The patient has symptomatic complete heart block in the setting of Coreg use She took her last dose yesterday morning Echo pending (being done now) If she remains in complete heart block this afternoon, will plan pacemaker implant. Risks, benefits reviewed with patient who wishes to proceed.  2.  HTN Elevated blood pressure this morning Will follow after pacemaker  Dr Johney Frame to see later today   Signed, Gypsy Balsam, NP 08/02/2017 8:59 AM  I have seen, examined the patient, and reviewed the above assessment and plan. On exam, RRR (paced).  V temp wire threshold is 0.70mA.  Underlying rhythm remains complete heart block with no escape despite cessation of coreg.  Changes to above are made where necessary.   The patient has symptomatic complete heart block.  No reversible causes are found.   I would therefore recommend pacemaker implantation at this time.  Risks, benefits, alternatives to pacemaker implantation were discussed in detail with the patient today. The patient understands that the risks include but are not limited to bleeding, infection, pneumothorax, perforation, tamponade, vascular damage, renal failure, MI, stroke, death,  and lead dislodgement and wishes to proceed. We will therefore schedule the procedure at the next available time.  She is planned for early afternoon.  Very  tenuous rhythm status.  At risk for further decompensation.  A high level of decision making was required for this encounter. Echo pending.  Co Sign: Hillis Range, MD 08/02/2017 11:32 AM

## 2017-08-03 ENCOUNTER — Inpatient Hospital Stay (HOSPITAL_COMMUNITY): Payer: Medicare Other

## 2017-08-03 ENCOUNTER — Encounter (HOSPITAL_COMMUNITY): Payer: Self-pay | Admitting: Internal Medicine

## 2017-08-03 LAB — BASIC METABOLIC PANEL
Anion gap: 14 (ref 5–15)
BUN: 31 mg/dL — ABNORMAL HIGH (ref 6–20)
CHLORIDE: 106 mmol/L (ref 101–111)
CO2: 20 mmol/L — AB (ref 22–32)
Calcium: 9.1 mg/dL (ref 8.9–10.3)
Creatinine, Ser: 0.91 mg/dL (ref 0.44–1.00)
GFR calc non Af Amer: 56 mL/min — ABNORMAL LOW (ref >60)
Glucose, Bld: 94 mg/dL (ref 65–99)
Potassium: 3.7 mmol/L (ref 3.5–5.1)
SODIUM: 140 mmol/L (ref 135–145)

## 2017-08-03 LAB — MRSA CULTURE: Culture: DETECTED

## 2017-08-03 LAB — CBC
HEMATOCRIT: 30.1 % — AB (ref 36.0–46.0)
Hemoglobin: 9.9 g/dL — ABNORMAL LOW (ref 12.0–15.0)
MCH: 28.5 pg (ref 26.0–34.0)
MCHC: 32.9 g/dL (ref 30.0–36.0)
MCV: 86.7 fL (ref 78.0–100.0)
Platelets: 205 10*3/uL (ref 150–400)
RBC: 3.47 MIL/uL — AB (ref 3.87–5.11)
RDW: 13.2 % (ref 11.5–15.5)
WBC: 6.5 10*3/uL (ref 4.0–10.5)

## 2017-08-03 LAB — GLUCOSE, CAPILLARY
GLUCOSE-CAPILLARY: 98 mg/dL (ref 65–99)
Glucose-Capillary: 174 mg/dL — ABNORMAL HIGH (ref 65–99)
Glucose-Capillary: 116 mg/dL — ABNORMAL HIGH (ref 65–99)

## 2017-08-03 NOTE — Discharge Summary (Signed)
ELECTROPHYSIOLOGY PROCEDURE DISCHARGE SUMMARY    Patient ID: Carmen Gilbert,  MRN: [id number], DOB/AGE: 81-Mar-1930 81 y.o.  Admit date: 08/01/2017 Discharge date: 08/03/2017  Primary Care Physician: Patient, No Pcp Per  Primary Cardiologist/Electrophysiologist: New to [facility], Dr. Johney Frame  Primary Discharge Diagnosis:  1. Syncope 2. CHB  Secondary Discharge Diagnosis:  1. DM      Diet controlled, per d/w RN at SNF 2. Dementia 3. HTN  Allergies  Allergen Reactions  . Ms Contin [Morphine Sulfate Er]     As noted on the patient's [facility]     Procedures This Admission:  1.  Implantation of a SJM dual chamber PPM on 08/02/17 by Dr Johney Frame.  The patient received a Public house [employer] MRI model [id number] (serial number  [id number]) pacemaker, [employer] Tendril MRI model [id number] (serial number  [id number]) right atrial lead and a [employer] Tendril MRI model [id number]  (serial number  [id number]) right ventricular lead There were no immediate post procedure complications. 2.  CXR on 08/03/17 demonstrated no pneumothorax status post device implantation.   Brief HPI: Carmen Gilbert is a 83 y.o. female was referred from [facility] after a syncopal episode at her SNF was found in CHB.  Hospital Course:  The patient with no significant past cardiac history, although the notes suggest some type of heart failure in the past. She had been on Coreg at home and took her last dose the day prior. She reported that for the last 2-3 days she has had weakness and dizziness. She had an episode of syncope at her SNF and EMS was called. On EMS arrival, she was found to be in complete heart block and transported to [facility] for further evaluation. She had recurrent ventricular standstill and underwent temporary pacemaker implant.  The patient remained in CHB despite no BB and underwent implantation of a PPM yesterday with details as outlined above. She was monitored on telemetry overnight which demonstrated SR/V  paced.  Left chest was without hematoma or ecchymosis.  The device was interrogated and found to be functioning normally.  CXR was obtained and demonstrated no pneumothorax status post device implantation.  Wound care, arm mobility, and restrictions are below.  The patient has some degree of dementia, I spoke with her though will need rev-enforcement at the SNF.  A niece came by today, the RN reported that in d/w her the patient's current mental status is at her baseline by the family account, and level of confusion waxes/wanes.  The patient was examined by Dr. Johney Frame and considered stable for discharge back to her SNF.  Routine f/u has been arranged.  Will resume her prior to hospital medicines unchanged.    SNF care and activity instructions: Please re-enforce with the patient     Supplemental Discharge Instructions for  Pacemaker/Defibrillator Patients  Activity No heavy lifting or vigorous activity with your left arm for 6 to 8 weeks.  Do not raise your left arm above your head for one week.  Gradually raise your affected arm as drawn below.             08/06/17                     08/07/17                    08/08/17                   08/09/17 __  NO DRIVING patient does not drive  WOUND CARE - Keep the wound area clean and dry.  Do not get this area wet, no showers until cleared to at wound check appointment -  tape/steri-strips on your wound will fall off; do not pull them off.  No bandage is needed on the site.  DO  NOT apply any creams, oils, or ointments to the wound area. - If you notice any drainage or discharge from the wound, any swelling or bruising at the site, or you develop a fever > 101? F after you are discharged home, call the office at once.  Special Instructions - You are still able to use cellular telephones; use the ear opposite the side where you have your pacemaker/defibrillator.  Avoid carrying your cellular phone near your device. - When traveling through  airports, show security personnel your identification card to avoid being screened in the metal detectors.  Ask the security personnel to use the hand wand. - Avoid arc welding equipment, MRI testing (magnetic resonance imaging), TENS units (transcutaneous nerve stimulators).  Call the office for questions about other devices. - Avoid electrical appliances that are in poor condition or are not properly grounded. - Microwave ovens are safe to be near or to operate.   RIGHT groin care No vigorous or sexual activity for 1 week.  Keep procedure site clean & dry. If you notice increased pain, swelling, bleeding or pus, call/return!  You may shower, but no soaking baths/hot tubs/pools for 1 week.     F/U Appointments 08/12/17 @ 0900, wound check appointment 11/07/17 @12 :15 follow up with Dr. Johney Frame     Physical Exam: Vitals:   08/03/17 1100 08/03/17 1118 08/03/17 1200 08/03/17 1300  BP: 131/60  (!) 133/49 139/60  Pulse: 78  65 68  Resp: (!) 21  18 (!) 23  Temp:  98.4 F (36.9 C)    TempSrc:  Oral    SpO2: 100%  100% 100%  Weight:      Height:        GEN- The patient is well appearing, alert and to self an place, she is able to tell me she is at [facility].   HEENT: normocephalic, atraumatic; sclera clear, conjunctiva pink; hearing intact; oropharynx clear; neck supple, no JVP Lungs- CTA b/l, normal work of breathing.  No wheezes, rales, rhonchi Heart- RRR, no murmurs, rubs or gallops, PMI not laterally displaced GI- soft, non-tender Extremities- no clubbing, cyanosis, Right groin site is stable, no bleeding or hematoma, no tenderness MS- no significant deformity or atrophy Skin- warm and dry, no rash or lesion, left chest without hematoma/ecchymosis Psych- euthymic mood, pleasant Neuro- no gross deficits    Labs:   Lab Results  Component Value Date   WBC 6.5 08/03/2017   HGB 9.9 (L) 08/03/2017   HCT 30.1 (L) 08/03/2017   MCV 86.7 08/03/2017   PLT 205 08/03/2017    Recent  Labs Lab 08/01/17 1959  08/03/17 0535  NA 130*  < > 140  K 4.4  < > 3.7  CL 100*  < > 106  CO2 21*  < > 20*  BUN 67*  < > 31*  CREATININE 2.03*  < > 0.91  CALCIUM 8.5*  < > 9.1  PROT 6.8  --   --   BILITOT 0.6  --   --   ALKPHOS 78  --   --   ALT 21  --   --   AST 23  --   --  GLUCOSE 105*  < > 94  < > = values in this interval not displayed.  Discharge Medications:  Allergies as of 08/03/2017      Reactions   Ms Contin [morphine Sulfate Er]    As noted on the patient's MAR      Medication List    TAKE these medications   acetaminophen 325 MG tablet Commonly known as:  TYLENOL Take 650 mg by mouth 4 (four) times daily.   aspirin 81 MG chewable tablet Chew 81 mg by mouth every morning.   atorvastatin 10 MG tablet Commonly known as:  LIPITOR Take 10 mg by mouth every evening.   capsaicin 0.025 % cream Commonly known as:  ZOSTRIX Apply 1 application topically 4 (four) times daily. APPLY TO BOTH SHOULDERS   conjugated estrogens vaginal cream Commonly known as:  PREMARIN Place 1 Applicatorful vaginally 2 (two) times a week. TUESDAYS and THURSDAYS   diphenhydrAMINE 25 mg capsule Commonly known as:  BENADRYL Take 25 mg by mouth at bedtime as needed for itching.   DULoxetine 60 MG capsule Commonly known as:  CYMBALTA Take 60 mg by mouth daily.   furosemide 20 MG tablet Commonly known as:  LASIX Take 40-60 mg by mouth See admin instructions. 60 mg in the morning at 8 AM and 40 mg in the afternoon at 4 PM   gabapentin 300 MG capsule Commonly known as:  NEURONTIN Take 300 mg by mouth at bedtime.   hydrALAZINE 10 MG tablet Commonly known as:  APRESOLINE Take 20 mg by mouth 3 (three) times daily.   latanoprost 0.005 % ophthalmic solution Commonly known as:  XALATAN Place 1 drop into both eyes at bedtime.   losartan 50 MG tablet Commonly known as:  COZAAR Take 75 mg by mouth every morning.   NAMENDA XR 14 MG Cp24 24 hr capsule Generic drug:   memantine Take 14 mg by mouth daily.   nitroGLYCERIN 0.4 MG SL tablet Commonly known as:  NITROSTAT Place 0.4 mg under the tongue every 5 (five) minutes x 3 doses as needed for chest pain.   NUCYNTA ER 100 MG 12 hr tablet Generic drug:  tapentadol Take 100 mg by mouth every 12 (twelve) hours.   omeprazole 20 MG capsule Commonly known as:  PRILOSEC Take 20 mg by mouth daily before breakfast.   oxyCODONE 5 MG immediate release tablet Commonly known as:  Oxy IR/ROXICODONE Take 5 mg by mouth every 4 (four) hours as needed (for mild pain).   polyethylene glycol powder powder Commonly known as:  GLYCOLAX/MIRALAX Take 17 g by mouth every morning. MIX INTO 8 OUNCES OF LIQUID (HOLD FOR LOOSE STOOLS)   polyvinyl alcohol 1.4 % ophthalmic solution Commonly known as:  LIQUIFILM TEARS Place 1 drop into both eyes 4 (four) times daily.   SENNA S 8.6-50 MG tablet Generic drug:  senna-docusate Take 2 tablets by mouth 2 (two) times daily.   sodium chloride 0.65 % Soln nasal spray Commonly known as:  OCEAN Place 1 spray into both nostrils every morning.   TAB-A-VITE Tabs Take 1 tablet by mouth daily.   Vitamin D (Ergocalciferol) 50000 units Caps capsule Commonly known as:  DRISDOL Take 50,000 Units by mouth every 30 (thirty) days. ON THE 15TH OF EVERY MONTH       Disposition: return to SNF Discharge Instructions    Diet - low sodium heart healthy    Complete by:  As directed    Increase activity slowly    Complete  by:  As directed      Follow-up Information    [facility] Office Follow up on 08/12/2017.   Specialty:  Cardiology Why:  9:00AM, wound check Contact information: [address] [place] [address] [phone]       Hillis Range, MD Follow up on 11/07/2017.   Specialty:  Cardiology Why:  12:15PM Contact information: [address] [place] [address] [phone]           Duration of Discharge  Encounter: Greater than 30 minutes including physician time.  Norma Fredrickson, PA-C 08/03/2017 2:18 PM   Hillis Range MD, [place] [facility]

## 2017-08-03 NOTE — Progress Notes (Signed)
LCSW made aware of patient discharge and return to facility  Evening CSW covering and assisting [facility] with [facility] Spoke with facility who is aware of discharge and return Patient is a long term resident at facility. Fax copy of DC summary sent due to lateness in evening of patient discharge. Confirmed return and facility agreeable. Difficult to reach family per facility as no one could be reached by this Clinical research associatewriter. Report:  [phone]  No other needs Patient to transport by EMS.  Carmen EmoryHannah Peytin Dechert LCSW, MSW Clinical Social Work: Optician, dispensingystem [employer] Coverage for :  [phone]

## 2017-08-03 NOTE — Discharge Instructions (Signed)
° ° °  Supplemental Discharge Instructions for  Pacemaker/Defibrillator Patients  Activity No heavy lifting or vigorous activity with your left/right arm for 6 to 8 weeks.  Do not raise your left/right arm above your head for one week.  Gradually raise your affected arm as drawn below.             08/06/17                     08/07/17                    08/08/17                  08/09/17 __  NO DRIVING patient doesn't drive  WOUND CARE - Keep the wound area clean and dry.  Do not get this area wet, no showers until cleared to at your wound check visit  - The tape/steri-strips on your wound will fall off; do not pull them off.  No bandage is needed on the site.  DO  NOT apply any creams, oils, or ointments to the wound area. - If you notice any drainage or discharge from the wound, any swelling or bruising at the site, or you develop a fever > 101? F after you are discharged home, call the office at once.  Special Instructions - You are still able to use cellular telephones; use the ear opposite the side where you have your pacemaker/defibrillator.  Avoid carrying your cellular phone near your device. - When traveling through airports, show security personnel your identification card to avoid being screened in the metal detectors.  Ask the security personnel to use the hand wand. - Avoid arc welding equipment, MRI testing (magnetic resonance imaging), TENS units (transcutaneous nerve stimulators).  Call the office for questions about other devices. - Avoid electrical appliances that are in poor condition or are not properly grounded. - Microwave ovens are safe to be near or to operate.  Additional information for defibrillator patients should your device go off: - If your device goes off ONCE and you feel fine afterward, notify the device clinic nurses. - If your device goes off ONCE and you do not feel well afterward, call 911. - If your device goes off TWICE, call 911. - If your device goes  off THREE times in one day, call 911.  DO NOT DRIVE YOURSELF OR A FAMILY MEMBER WITH A DEFIBRILLATOR TO THE HOSPITAL--CALL 911.

## 2017-08-03 NOTE — Progress Notes (Signed)
Device interrogation is reviewed and normal CXR reveals stable leads, no ptx  Anticipate discharge, possibly today with routine wound care and follow-up  Hillis RangeJames Nasirah Sachs MD, [facility] 08/03/2017 12:28 PM

## 2017-08-12 ENCOUNTER — Ambulatory Visit: Payer: Medicare Other

## 2017-08-15 ENCOUNTER — Encounter: Payer: Self-pay | Admitting: Internal Medicine

## 2017-10-24 ENCOUNTER — Encounter: Payer: Self-pay | Admitting: *Deleted

## 2017-11-07 ENCOUNTER — Encounter: Payer: Medicare Other | Admitting: Internal Medicine

## 2017-11-08 ENCOUNTER — Encounter: Payer: Self-pay | Admitting: Internal Medicine

## 2018-02-06 ENCOUNTER — Encounter: Payer: Medicare Other | Admitting: *Deleted

## 2018-02-06 ENCOUNTER — Telehealth: Payer: Self-pay | Admitting: Cardiology

## 2018-02-06 NOTE — Telephone Encounter (Signed)
Attempted to confirm remote transmission with pt. No answer and was unable to leave a message. Automated message stating that the number is disconnected.  

## 2018-02-08 ENCOUNTER — Encounter: Payer: Self-pay | Admitting: Cardiology

## 2018-03-10 IMAGING — DX DG CHEST 2V
2 series · 2 of 2 positions shown · non-contrast
Comparison: 08/01/2017

CLINICAL DATA: Status post pacemaker placement

EXAM:
CHEST  2 VIEW

[chest lat]
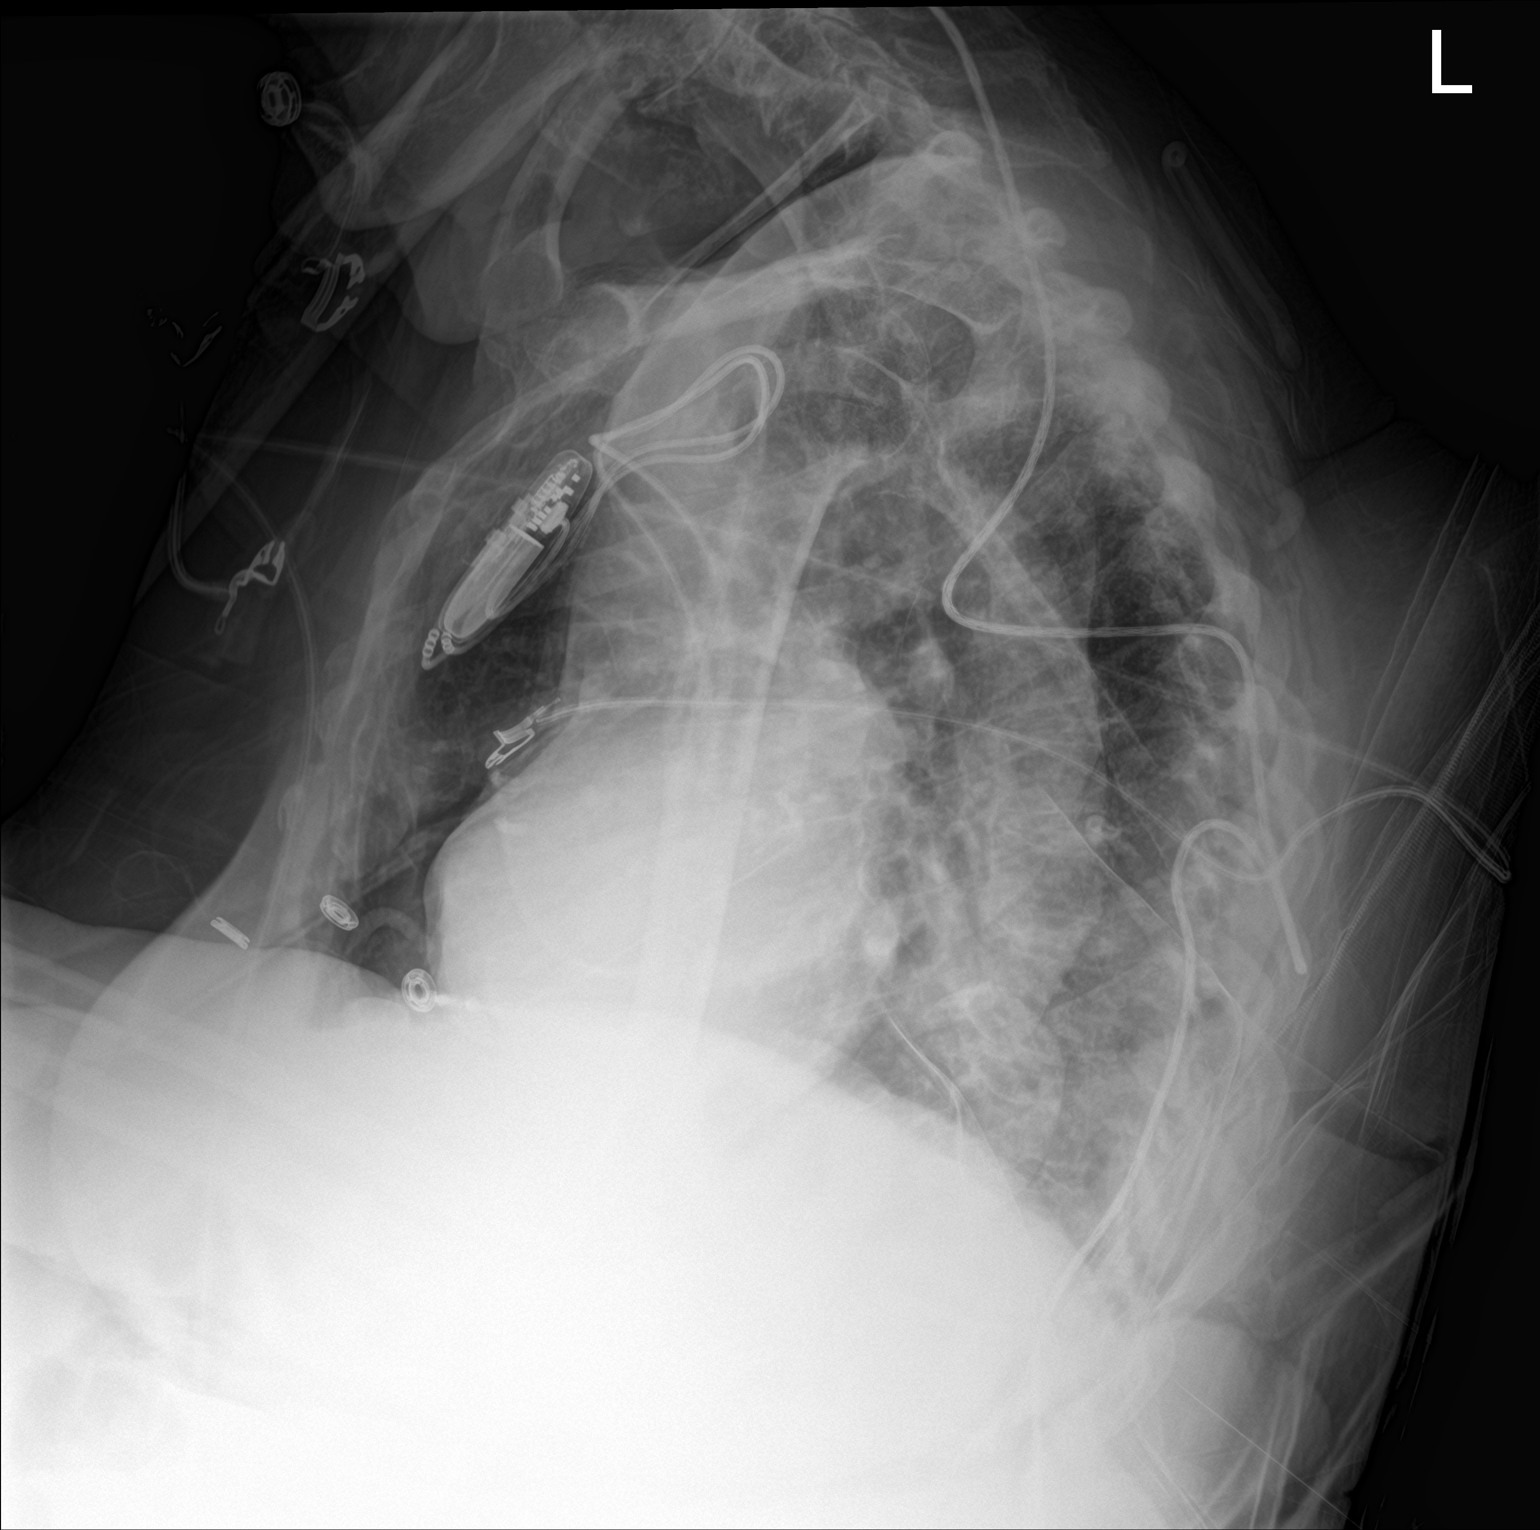

[chest ap]
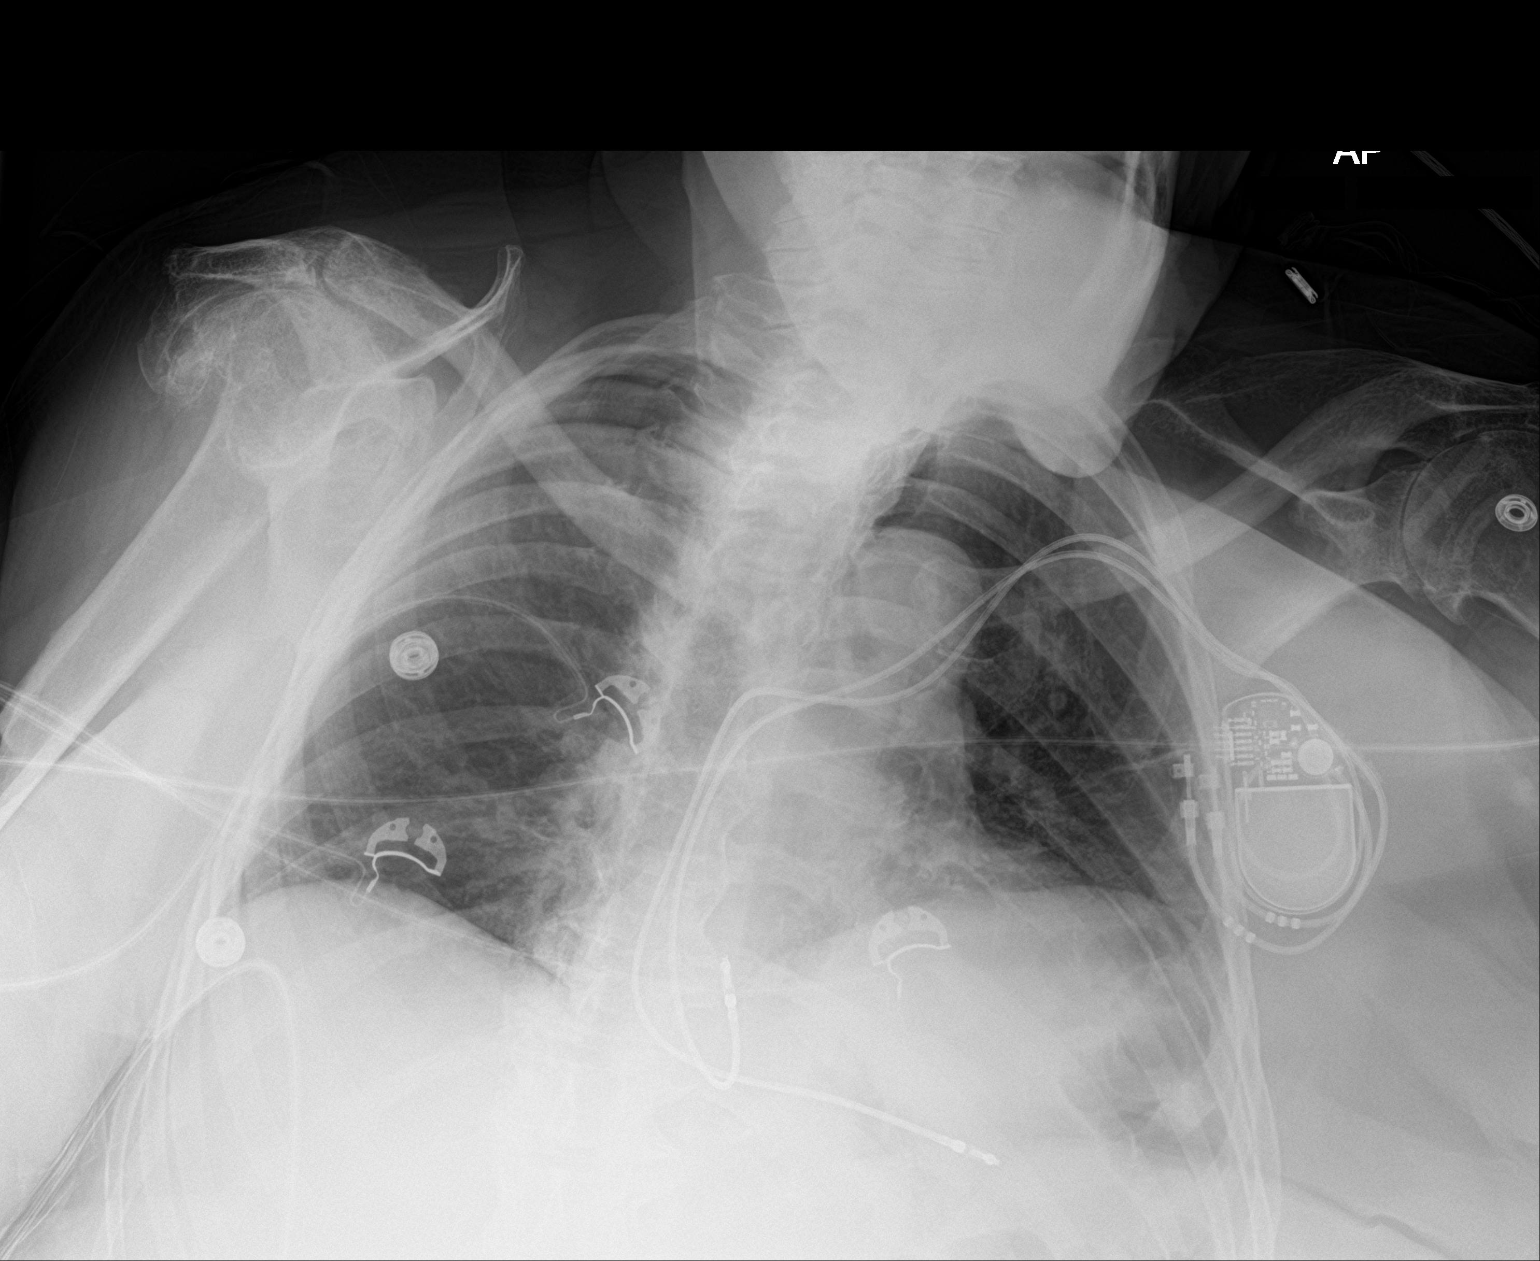

[2 of 2 positions shown; findings below may reference images not displayed]

FINDINGS: Cardiac shadow is stable. A pacing device is now seen in
satisfactory position. The overall inspiratory effort is poor. No
focal infiltrate is seen. No pneumothorax is noted. Degenerative
changes of the shoulder joints are seen bilaterally.
IMPRESSION: No pneumothorax following pacemaker placement.

Poor inspiratory effort.

## 2018-06-20 ENCOUNTER — Encounter: Payer: Self-pay | Admitting: Cardiology

## 2018-07-21 ENCOUNTER — Encounter: Payer: Self-pay | Admitting: Cardiology

## 2018-09-05 ENCOUNTER — Ambulatory Visit (INDEPENDENT_AMBULATORY_CARE_PROVIDER_SITE_OTHER): Payer: Medicare Other | Admitting: *Deleted

## 2018-09-05 DIAGNOSIS — I442 Atrioventricular block, complete: Secondary | ICD-10-CM

## 2018-09-06 NOTE — Progress Notes (Signed)
Remote pacemaker transmission.   

## 2018-09-27 ENCOUNTER — Telehealth: Payer: Self-pay | Admitting: Internal Medicine

## 2018-09-27 ENCOUNTER — Encounter: Payer: Self-pay | Admitting: Internal Medicine

## 2018-09-27 LAB — CUP PACEART REMOTE DEVICE CHECK
Battery Remaining Longevity: 89 mo
Battery Voltage: 3.01 V
Brady Statistic RA Percent Paced: 37 %
Implantable Lead Implant Date: 20180807
Implantable Lead Location: 753859
Implantable Pulse Generator Implant Date: 20180807
Lead Channel Pacing Threshold Amplitude: 1 V
Lead Channel Sensing Intrinsic Amplitude: 3.1 mV
Lead Channel Setting Pacing Amplitude: 0.875
Lead Channel Setting Sensing Sensitivity: 4 mV
MDC IDC LEAD IMPLANT DT: 20180807
MDC IDC LEAD LOCATION: 753860
MDC IDC MSMT BATTERY REMAINING PERCENTAGE: 95.5 %
MDC IDC MSMT LEADCHNL RA IMPEDANCE VALUE: 380 Ohm
MDC IDC MSMT LEADCHNL RA PACING THRESHOLD PULSEWIDTH: 0.2 ms
MDC IDC MSMT LEADCHNL RV IMPEDANCE VALUE: 490 Ohm
MDC IDC MSMT LEADCHNL RV PACING THRESHOLD AMPLITUDE: 0.625 V
MDC IDC MSMT LEADCHNL RV PACING THRESHOLD PULSEWIDTH: 0.5 ms
MDC IDC SESS DTM: 20190910163507
MDC IDC SET LEADCHNL RA PACING AMPLITUDE: 3.5 V
MDC IDC SET LEADCHNL RV PACING PULSEWIDTH: 0.5 ms
MDC IDC STAT BRADY AP VP PERCENT: 38 %
MDC IDC STAT BRADY AP VS PERCENT: 1 %
MDC IDC STAT BRADY AS VP PERCENT: 62 %
MDC IDC STAT BRADY AS VS PERCENT: 1 %
MDC IDC STAT BRADY RV PERCENT PACED: 99 %
Pulse Gen Serial Number: 8930546

## 2018-12-05 ENCOUNTER — Telehealth: Payer: Self-pay | Admitting: Cardiology

## 2018-12-05 NOTE — Telephone Encounter (Signed)
Confirmed remote transmission w/ pt niece. Pt niece [facility].

## 2018-12-08 ENCOUNTER — Encounter: Payer: Self-pay | Admitting: Cardiology

## 2019-05-09 ENCOUNTER — Telehealth: Payer: Self-pay

## 2019-05-09 NOTE — Telephone Encounter (Signed)
I spoke with the pt nurse and she agreed to have the pt to send a transmission with her monitor. The nurse also going to call the scheduler to make an appointment for the pt. The pt is overdue for an appointment to see Dr. Johney Frame.

## 2019-05-10 ENCOUNTER — Other Ambulatory Visit: Payer: Self-pay

## 2019-05-10 ENCOUNTER — Ambulatory Visit (INDEPENDENT_AMBULATORY_CARE_PROVIDER_SITE_OTHER): Payer: Medicare Other | Admitting: *Deleted

## 2019-05-10 DIAGNOSIS — I442 Atrioventricular block, complete: Secondary | ICD-10-CM | POA: Diagnosis not present

## 2019-05-10 LAB — CUP PACEART REMOTE DEVICE CHECK
Date Time Interrogation Session: 20200514191836
Implantable Lead Implant Date: 20180807
Implantable Lead Implant Date: 20180807
Implantable Lead Location: 753859
Implantable Lead Location: 753860
Implantable Pulse Generator Implant Date: 20180807
Pulse Gen Model: 2272
Pulse Gen Serial Number: 8930546

## 2019-05-10 NOTE — Telephone Encounter (Signed)
I called to get the nurse to send a transmission with the pt home monitor however, I did not get an answer from the nurse. I was unable to leave a message.

## 2019-05-11 NOTE — Telephone Encounter (Signed)
I have tried to call the pt nurse to get her to send a transmission however I did not get an answer. I was not able to leave a voicemail.

## 2019-05-16 ENCOUNTER — Encounter: Payer: Self-pay | Admitting: Cardiology

## 2019-05-16 NOTE — Progress Notes (Signed)
Remote pacemaker transmission.   

## 2019-05-23 NOTE — Telephone Encounter (Signed)
Letter sent 05/23/2019

## 2019-08-09 ENCOUNTER — Ambulatory Visit (INDEPENDENT_AMBULATORY_CARE_PROVIDER_SITE_OTHER): Payer: Medicare Other | Admitting: *Deleted

## 2019-08-09 DIAGNOSIS — I442 Atrioventricular block, complete: Secondary | ICD-10-CM | POA: Diagnosis not present

## 2019-08-13 LAB — CUP PACEART REMOTE DEVICE CHECK
Battery Remaining Longevity: 88 mo
Battery Remaining Percentage: 95.5 %
Battery Voltage: 2.98 V
Brady Statistic AP VP Percent: 40 %
Brady Statistic AP VS Percent: 1 %
Brady Statistic AS VP Percent: 60 %
Brady Statistic AS VS Percent: 1 %
Brady Statistic RA Percent Paced: 39 %
Brady Statistic RV Percent Paced: 99 %
Date Time Interrogation Session: 20200816215524
Implantable Lead Implant Date: 20180807
Implantable Lead Implant Date: 20180807
Implantable Lead Location: 753859
Implantable Lead Location: 753860
Implantable Pulse Generator Implant Date: 20180807
Lead Channel Impedance Value: 380 Ohm
Lead Channel Impedance Value: 490 Ohm
Lead Channel Pacing Threshold Amplitude: 0.625 V
Lead Channel Pacing Threshold Amplitude: 1 V
Lead Channel Pacing Threshold Pulse Width: 0.2 ms
Lead Channel Pacing Threshold Pulse Width: 0.5 ms
Lead Channel Sensing Intrinsic Amplitude: 2.6 mV
Lead Channel Setting Pacing Amplitude: 0.875
Lead Channel Setting Pacing Amplitude: 3.5 V
Lead Channel Setting Pacing Pulse Width: 0.5 ms
Lead Channel Setting Sensing Sensitivity: 4 mV
Pulse Gen Model: 2272
Pulse Gen Serial Number: 8930546

## 2019-08-20 ENCOUNTER — Encounter: Payer: Self-pay | Admitting: Cardiology

## 2019-08-20 NOTE — Progress Notes (Signed)
Remote pacemaker transmission.   

## 2019-11-08 ENCOUNTER — Encounter: Payer: Medicare Other | Admitting: *Deleted

## 2019-11-12 ENCOUNTER — Telehealth: Payer: Self-pay

## 2019-11-12 NOTE — Telephone Encounter (Signed)
Unable to call the patient to remind of missed remote transmission

## 2019-11-13 ENCOUNTER — Ambulatory Visit (INDEPENDENT_AMBULATORY_CARE_PROVIDER_SITE_OTHER): Payer: Medicare Other | Admitting: *Deleted

## 2019-11-13 DIAGNOSIS — I442 Atrioventricular block, complete: Secondary | ICD-10-CM

## 2019-11-14 LAB — CUP PACEART REMOTE DEVICE CHECK
Battery Remaining Longevity: 86 mo
Battery Remaining Percentage: 95.5 %
Battery Voltage: 2.99 V
Brady Statistic AP VP Percent: 45 %
Brady Statistic AP VS Percent: 1 %
Brady Statistic AS VP Percent: 54 %
Brady Statistic AS VS Percent: 1 %
Brady Statistic RA Percent Paced: 45 %
Brady Statistic RV Percent Paced: 99 %
Date Time Interrogation Session: 20201117164053
Implantable Lead Implant Date: 20180807
Implantable Lead Implant Date: 20180807
Implantable Lead Location: 753859
Implantable Lead Location: 753860
Implantable Pulse Generator Implant Date: 20180807
Lead Channel Impedance Value: 360 Ohm
Lead Channel Impedance Value: 480 Ohm
Lead Channel Pacing Threshold Amplitude: 0.5 V
Lead Channel Pacing Threshold Amplitude: 1 V
Lead Channel Pacing Threshold Pulse Width: 0.2 ms
Lead Channel Pacing Threshold Pulse Width: 0.5 ms
Lead Channel Sensing Intrinsic Amplitude: 2.1 mV
Lead Channel Sensing Intrinsic Amplitude: 7.9 mV
Lead Channel Setting Pacing Amplitude: 0.75 V
Lead Channel Setting Pacing Amplitude: 3.5 V
Lead Channel Setting Pacing Pulse Width: 0.5 ms
Lead Channel Setting Sensing Sensitivity: 4 mV
Pulse Gen Model: 2272
Pulse Gen Serial Number: 8930546

## 2019-12-11 NOTE — Progress Notes (Signed)
Remote pacemaker transmission.   

## 2020-03-12 ENCOUNTER — Ambulatory Visit (INDEPENDENT_AMBULATORY_CARE_PROVIDER_SITE_OTHER): Payer: Medicare Other | Admitting: *Deleted

## 2020-03-12 DIAGNOSIS — I442 Atrioventricular block, complete: Secondary | ICD-10-CM | POA: Diagnosis not present

## 2020-03-12 LAB — CUP PACEART REMOTE DEVICE CHECK
Battery Remaining Longevity: 80 mo
Battery Remaining Percentage: 95.5 %
Battery Voltage: 2.96 V
Brady Statistic AP VP Percent: 51 %
Brady Statistic AP VS Percent: 1 %
Brady Statistic AS VP Percent: 48 %
Brady Statistic AS VS Percent: 1 %
Brady Statistic RA Percent Paced: 50 %
Brady Statistic RV Percent Paced: 99 %
Date Time Interrogation Session: 20210316213806
Implantable Lead Implant Date: 20180807
Implantable Lead Implant Date: 20180807
Implantable Lead Location: 753859
Implantable Lead Location: 753860
Implantable Pulse Generator Implant Date: 20180807
Lead Channel Impedance Value: 360 Ohm
Lead Channel Impedance Value: 490 Ohm
Lead Channel Pacing Threshold Amplitude: 0.5 V
Lead Channel Pacing Threshold Amplitude: 1 V
Lead Channel Pacing Threshold Pulse Width: 0.2 ms
Lead Channel Pacing Threshold Pulse Width: 0.5 ms
Lead Channel Sensing Intrinsic Amplitude: 2.9 mV
Lead Channel Sensing Intrinsic Amplitude: 7.6 mV
Lead Channel Setting Pacing Amplitude: 0.75 V
Lead Channel Setting Pacing Amplitude: 3.5 V
Lead Channel Setting Pacing Pulse Width: 0.5 ms
Lead Channel Setting Sensing Sensitivity: 4 mV
Pulse Gen Model: 2272
Pulse Gen Serial Number: 8930546

## 2020-03-12 NOTE — Progress Notes (Signed)
PPM Remote  

## 2020-03-14 ENCOUNTER — Telehealth: Payer: Self-pay | Admitting: Student

## 2020-03-14 NOTE — Telephone Encounter (Signed)
Remote reviewed by Dr. Johney Frame shows outputs still in acute settings. Requested to bring her in for appointment.   Only number on file is for her niece, who states she is at the rehab center in [place] on "[address]   Can we please call [facility] [phone] to see about scheduling patient for an appointment?   Ok for device clinic or APP.  Casimiro Needle [address]" [place], [place]  03/14/2020 8:33 AM

## 2020-03-14 NOTE — Telephone Encounter (Signed)
[facility] has been notified and appointment scheduled with their staff for device programming change per Dr Johney Frame. Niece was notified of appointment.

## 2020-03-19 NOTE — Progress Notes (Deleted)
Cardiology Office Note Date:  03/19/2020  Patient ID:  Carmen [place], DOB Apr 06, 1929, MRN [id number] PCP:  Patient, No Pcp Per  Cardiologist:  *** Electrophysiologist: Dr. Rayann Heman  ***refresh   Chief Complaint: *** lost to clinic f/u  History of Present Illness: Carmen Gilbert is a 86 y.o. female with history of DM, dementia, CHB w/PPM, ?CHF, HTN.  She comes in today to be seen for Dr. Rayann Heman.  She was hospitalized and had PPM implanted for CHB/syncope 2018, discharged to her SNF afterwards.   She had routine follow up in place though seems lost to follow up.  Recently noted to still have lead output at acute implant settings and recommended to come in for device check and reprogramming if able.  *** symptoms *** dementia *** meds *** labs, lipids... *** family hx  Device information SJM dual chamber PPM, implanted 08/02/2017  Past Medical History:  Diagnosis Date  . CHF (congestive heart failure) ([place])   . Dementia   . Diabetes mellitus without complication ([place])   . Hypertension     Past Surgical History:  Procedure Laterality Date  . PACEMAKER IMPLANT N/A 08/02/2017   Procedure: Pacemaker Implant;  Surgeon: Thompson Grayer, MD;  Location: [place] [facility];  Service: Cardiovascular;  Laterality: N/A;  . TEMPORARY PACEMAKER N/A 08/01/2017   Procedure: Temporary Pacemaker;  Surgeon: Troy Sine, MD;  Location: [place] [facility];  Service: Cardiovascular;  Laterality: N/A;    Current Outpatient Medications  Medication Sig Dispense Refill  . acetaminophen (TYLENOL) 325 MG tablet Take 650 mg by mouth 4 (four) times daily.    Marland Kitchen aspirin 81 MG chewable tablet Chew 81 mg by mouth every morning.    Marland Kitchen atorvastatin (LIPITOR) 10 MG tablet Take 10 mg by mouth every evening.    . capsaicin (ZOSTRIX) 0.025 % cream Apply 1 application topically 4 (four) times daily. APPLY TO BOTH SHOULDERS    . conjugated estrogens (PREMARIN) vaginal cream Place 1 Applicatorful vaginally 2 (two) times a  week. TUESDAYS and THURSDAYS    . diphenhydrAMINE (BENADRYL) 25 mg capsule Take 25 mg by mouth at bedtime as needed for itching.    . DULoxetine (CYMBALTA) 60 MG capsule Take 60 mg by mouth daily.    . furosemide (LASIX) 20 MG tablet Take 40-60 mg by mouth See admin instructions. 60 mg in the morning at 8 AM and 40 mg in the afternoon at 4 PM    . gabapentin (NEURONTIN) 300 MG capsule Take 300 mg by mouth at bedtime.    . hydrALAZINE (APRESOLINE) 10 MG tablet Take 20 mg by mouth 3 (three) times daily.    Marland Kitchen latanoprost (XALATAN) 0.005 % ophthalmic solution Place 1 drop into both eyes at bedtime.    Marland Kitchen losartan (COZAAR) 50 MG tablet Take 75 mg by mouth every morning.    . memantine (NAMENDA XR) 14 MG CP24 24 hr capsule Take 14 mg by mouth daily.    . Multiple Vitamin (TAB-A-VITE) TABS Take 1 tablet by mouth daily.    . nitroGLYCERIN (NITROSTAT) 0.4 MG SL tablet Place 0.4 mg under the tongue every 5 (five) minutes x 3 doses as needed for chest pain.    Marland Kitchen omeprazole (PRILOSEC) 20 MG capsule Take 20 mg by mouth daily before breakfast.    . oxyCODONE (OXY IR/ROXICODONE) 5 MG immediate release tablet Take 5 mg by mouth every 4 (four) hours as needed (for mild pain).    . polyethylene glycol powder (GLYCOLAX/MIRALAX) powder  Take 17 g by mouth every morning. MIX INTO 8 OUNCES OF LIQUID (HOLD FOR LOOSE STOOLS)    . polyvinyl alcohol (LIQUIFILM TEARS) 1.4 % ophthalmic solution Place 1 drop into both eyes 4 (four) times daily.    Marland Kitchen senna-docusate (SENNA S) 8.6-50 MG tablet Take 2 tablets by mouth 2 (two) times daily.    . sodium chloride (OCEAN) 0.65 % SOLN nasal spray Place 1 spray into both nostrils every morning.    . tapentadol (NUCYNTA ER) 100 MG 12 hr tablet Take 100 mg by mouth every 12 (twelve) hours.    . Vitamin D, Ergocalciferol, (DRISDOL) 50000 units CAPS capsule Take 50,000 Units by mouth every 30 (thirty) days. ON THE 15TH OF EVERY MONTH     No current facility-administered medications for this  visit.    Allergies:   Ms contin [morphine sulfate er]   Social History:  The patient  reports that she has never smoked. She has never used smokeless tobacco. She reports that she does not drink alcohol or use drugs.   Family History:  The patient's family history is not on file.***  ROS:  Please see the history of present illness.  All other systems are reviewed and otherwise negative.   PHYSICAL EXAM: *** VS:  There were no vitals taken for this visit. BMI: There is no height or weight on file to calculate BMI. Well nourished, well developed, in no acute distress  HEENT: normocephalic, atraumatic  Neck: no JVD, carotid bruits or masses Cardiac:  *** RRR; no significant murmurs, no rubs, or gallops Lungs:  *** CTA b/l, no wheezing, rhonchi or rales  Abd: soft, nontender MS: no deformity or *** atrophy Ext: *** no edema  Skin: warm and dry, no rash Neuro:  No gross deficits appreciated Psych: euthymic mood, full affect  *** PPM site is stable, no tethering or discomfort   EKG:  Done today and reviewed by myself shows ***  PPM interrogation done today and reviewed by myself; ***  08/02/2017: TTE Impressions:  - LVEF 60-65%, mild LVH with severe assymetric septal hypertrophy  (no LVOT obstruction), there is a mobile filamentous structure  seen in numerous views in the LVOT - ?thrombus or partially flail  mitral valve cord, however, no significant MR or prolapse is  noted, grade 1 DD with elevated LV filling pressure. Consider TEE  for further evaluation.    Recent Labs: No results found for requested labs within last 8760 hours.  No results found for requested labs within last 8760 hours.   CrCl cannot be calculated (Patient's most recent lab result is older than the maximum 21 days allowed.).   Wt Readings from Last 3 Encounters:  08/03/17 200 lb 2.8 oz (90.8 kg)     Other studies reviewed: Additional studies/records reviewed today include: summarized  above  ASSESSMENT AND PLAN:  1. PPM     ***  2. HTN     ***  3. ? Hx of CHF     Asymmetrical septal hypertrophy without obstruction on her echo in 2018     ? ?thrombus or partially flailmitral valve cord, however, no significant MR or prolapse isnoted, further evaluation not pursued (likely 2/2 advanced age, dementia, and no clinical sequale of either)       ***  Disposition: F/u with ***  Current medicines are reviewed at length with the patient today.  The patient did not have any concerns regarding medicines.***  Signed, Francis Dowse, PA-C 03/19/2020 7:57 AM  [place]  [place] [address] [phone] (office)  [phone] (fax)

## 2020-03-20 ENCOUNTER — Encounter: Payer: Medicare Other | Admitting: Physician Assistant

## 2020-03-24 NOTE — Telephone Encounter (Addendum)
Attempted to reach nursing staff at [facility] as patient missed appointment on 03/20/20. Held for >9 minutes, no answer. Will try again at a later date.  Per Dr. Johney Frame, okay to transfer care to Dr. Elberta Fortis at [place] office if patient is unable to come to [place].

## 2020-03-27 NOTE — Progress Notes (Signed)
Electrophysiology Office Note Date: 03/28/2020  ID:  Carmen [place], DOB 1929/08/27, MRN [id number]  PCP: Galvin Proffer, MD Primary Cardiologist: No primary care provider on file. Electrophysiologist: Dr. Johney Frame CC: Pacemaker follow-up  Carmen Gilbert is a 86 y.o. female seen today for Dr. Johney Frame . she presents today for routine electrophysiology followup.  Since last being seen in our clinic, the patient reports doing well. She is at a facility in [place]. No family accompanies her today. She denies dizziness or lightheadedness. Is happy and feels like she is doing well at her facility. No issues with her device or device pocket. She denies chest pain, palpitations, dyspnea, PND, orthopnea, nausea, vomiting, , syncope, edema, weight gain, or early satiety.  Device History: St. Jude Dual Chamber PPM implanted 08/02/2017 for syncope/CHB.  Past Medical History:  Diagnosis Date  . CHF (congestive heart failure) ([facility])   . Dementia ([facility])   . Diabetes mellitus without complication ([facility])   . Hypertension    Past Surgical History:  Procedure Laterality Date  . PACEMAKER IMPLANT N/A 08/02/2017   Procedure: Pacemaker Implant;  Surgeon: Hillis Range, MD;  Location: [facility];  Service: Cardiovascular;  Laterality: N/A;  . TEMPORARY PACEMAKER N/A 08/01/2017   Procedure: Temporary Pacemaker;  Surgeon: Lennette Bihari, MD;  Location: [facility];  Service: Cardiovascular;  Laterality: N/A;    Current Outpatient Medications  Medication Sig Dispense Refill  . acetaminophen (TYLENOL) 325 MG tablet Take 650 mg by mouth 4 (four) times daily.    Carmen Kitchen aspirin 81 MG chewable tablet Chew 81 mg by mouth every morning.    Carmen Kitchen atorvastatin (LIPITOR) 10 MG tablet Take 10 mg by mouth every evening.    . capsaicin (ZOSTRIX) 0.025 % cream Apply 1 application topically 4 (four) times daily. APPLY TO BOTH SHOULDERS    . conjugated estrogens (PREMARIN) vaginal cream Place 1 Applicatorful vaginally 2 (two)  times a week. TUESDAYS and THURSDAYS    . diphenhydrAMINE (BENADRYL) 25 mg capsule Take 25 mg by mouth at bedtime as needed for itching.    . DULoxetine (CYMBALTA) 60 MG capsule Take 60 mg by mouth daily.    . furosemide (LASIX) 20 MG tablet Take 40-60 mg by mouth See admin instructions. 60 mg in the morning at 8 AM and 40 mg in the afternoon at 4 PM    . gabapentin (NEURONTIN) 300 MG capsule Take 300 mg by mouth at bedtime.    . hydrALAZINE (APRESOLINE) 10 MG tablet Take 20 mg by mouth 3 (three) times daily.    Carmen Kitchen latanoprost (XALATAN) 0.005 % ophthalmic solution Place 1 drop into both eyes at bedtime.    Carmen Kitchen losartan (COZAAR) 50 MG tablet Take 75 mg by mouth every morning.    . memantine (NAMENDA XR) 14 MG CP24 24 hr capsule Take 14 mg by mouth daily.    . Multiple Vitamin (TAB-A-VITE) TABS Take 1 tablet by mouth daily.    . nitroGLYCERIN (NITROSTAT) 0.4 MG SL tablet Place 0.4 mg under the tongue every 5 (five) minutes x 3 doses as needed for chest pain.    Carmen Kitchen omeprazole (PRILOSEC) 20 MG capsule Take 20 mg by mouth daily before breakfast.    . oxyCODONE (OXY IR/ROXICODONE) 5 MG immediate release tablet Take 5 mg by mouth every 4 (four) hours as needed (for mild pain).    . polyethylene glycol powder (GLYCOLAX/MIRALAX) powder Take 17 g by mouth every morning. MIX INTO 8 OUNCES OF LIQUID (  HOLD FOR LOOSE STOOLS)    . polyvinyl alcohol (LIQUIFILM TEARS) 1.4 % ophthalmic solution Place 1 drop into both eyes 4 (four) times daily.    Carmen Kitchen senna-docusate (SENNA S) 8.6-50 MG tablet Take 2 tablets by mouth 2 (two) times daily.    . sodium chloride (OCEAN) 0.65 % SOLN nasal spray Place 1 spray into both nostrils every morning.    . tapentadol (NUCYNTA ER) 100 MG 12 hr tablet Take 100 mg by mouth every 12 (twelve) hours.    . Vitamin D, Ergocalciferol, (DRISDOL) 50000 units CAPS capsule Take 50,000 Units by mouth every 30 (thirty) days. ON THE 15TH OF EVERY MONTH     No current facility-administered medications  for this visit.    Allergies:   Ms contin [morphine sulfate er]   Social History: Social History   Socioeconomic History  . Marital status: Married    Spouse name: Not on file  . Number of children: Not on file  . Years of education: Not on file  . Highest education level: Not on file  Occupational History  . Not on file  Tobacco Use  . Smoking status: Never Smoker  . Smokeless tobacco: Never Used  Substance and Sexual Activity  . Alcohol use: No  . Drug use: No  . Sexual activity: Not on file  Other Topics Concern  . Not on file  Social History Narrative  . Not on file   Social Determinants of Health   Financial Resource Strain:   . Difficulty of Paying Living Expenses:   Food Insecurity:   . Worried About Programme researcher, broadcasting/film/video in the Last Year:   . Barista in the Last Year:   Transportation Needs:   . Freight forwarder (Medical):   Carmen Kitchen Lack of Transportation (Non-Medical):   Physical Activity:   . Days of Exercise per Week:   . Minutes of Exercise per Session:   Stress:   . Feeling of Stress :   Social Connections:   . Frequency of Communication with Friends and Family:   . Frequency of Social Gatherings with Friends and Family:   . Attends Religious Services:   . Active Member of Clubs or Organizations:   . Attends Banker Meetings:   Carmen Kitchen Marital Status:   Intimate Partner Violence:   . Fear of Current or Ex-Partner:   . Emotionally Abused:   Carmen Kitchen Physically Abused:   . Sexually Abused:     Family History: No family history on file.   Review of Systems: All other systems reviewed and are otherwise negative except as noted above.  Physical Exam: Vitals:   03/28/20 1212  BP: 117/70  Pulse: 60  SpO2: 96%  Height: 5' (1.524 m)     GEN- The patient is well appearing, alert and oriented x 3 today.   HEENT: normocephalic, atraumatic; sclera clear, conjunctiva pink; hearing intact; oropharynx clear; neck supple  Lungs- Clear to  ausculation bilaterally, normal work of breathing.  No wheezes, rales, rhonchi Heart- Regular rate and rhythm, no murmurs, rubs or gallops  GI- soft, non-tender, non-distended, bowel sounds present  Extremities- no clubbing, cyanosis, or edema  MS- no significant deformity or atrophy Skin- warm and dry, no rash or lesion; PPM pocket well healed Psych- euthymic mood, full affect Neuro- strength and sensation are intact  PPM Interrogation- reviewed in detail today,  See PACEART report  EKG:  EKG is ordered today. The ekg ordered today shows AV dual paced  at 60 bpm with LBBB at 186 ms.   Recent Labs: No results found for requested labs within last 8760 hours.   Wt Readings from Last 3 Encounters:  08/03/17 200 lb 2.8 oz (90.8 kg)     Other studies Reviewed: Additional studies/ records that were reviewed today include: Echo 07/2017 shows LVEF 60-65%, Previous EP office notes, Previous remote checks, Most recent labwork.   Assessment and Plan:  1. CHB s/p St. Jude PPM  Normal PPM function See Claudia Desanctis Art report No changes today Changed from acute settings today, as patient had not followed up since implant, and only sent intermittent remotes. Remotes on scheduled currently.   2. LBBB Paced QRS is quite wide, but stable. Would treat conservatively given age and immobility. She has no s/s of CHF at this time.    Current medicines are reviewed at length with the patient today.   The patient does not have concerns regarding her medicines.  The following changes were made today:  none  Labs/ tests ordered today include:  Orders Placed This Encounter  Procedures  . EKG 12-Lead    Disposition:   Follow up with Dr. Curt Bears in [place] for ease of transport in 12 months.     Jacalyn Lefevre, PA-C  03/28/2020 12:29 PM  [place] [address] [phone] (office) [phone] (fax)

## 2020-03-28 ENCOUNTER — Ambulatory Visit (INDEPENDENT_AMBULATORY_CARE_PROVIDER_SITE_OTHER): Payer: Medicare Other | Admitting: Student

## 2020-03-28 ENCOUNTER — Encounter: Payer: Self-pay | Admitting: Student

## 2020-03-28 ENCOUNTER — Other Ambulatory Visit: Payer: Self-pay

## 2020-03-28 VITALS — BP 117/70 | HR 60 | Ht 60.0 in

## 2020-03-28 DIAGNOSIS — I442 Atrioventricular block, complete: Secondary | ICD-10-CM

## 2020-03-28 DIAGNOSIS — I447 Left bundle-branch block, unspecified: Secondary | ICD-10-CM

## 2020-03-28 LAB — CUP PACEART INCLINIC DEVICE CHECK
Battery Remaining Longevity: 122 mo
Battery Voltage: 2.99 V
Brady Statistic RA Percent Paced: 51 %
Brady Statistic RV Percent Paced: 99 %
Date Time Interrogation Session: 20210402131637
Implantable Lead Implant Date: 20180807
Implantable Lead Implant Date: 20180807
Implantable Lead Location: 753859
Implantable Lead Location: 753860
Implantable Pulse Generator Implant Date: 20180807
Lead Channel Impedance Value: 387.5 Ohm
Lead Channel Impedance Value: 525 Ohm
Lead Channel Pacing Threshold Amplitude: 0.5 V
Lead Channel Pacing Threshold Amplitude: 0.75 V
Lead Channel Pacing Threshold Amplitude: 0.75 V
Lead Channel Pacing Threshold Pulse Width: 0.5 ms
Lead Channel Pacing Threshold Pulse Width: 0.5 ms
Lead Channel Pacing Threshold Pulse Width: 0.5 ms
Lead Channel Sensing Intrinsic Amplitude: 2.9 mV
Lead Channel Sensing Intrinsic Amplitude: 7.6 mV
Lead Channel Setting Pacing Amplitude: 0.75 V
Lead Channel Setting Pacing Amplitude: 2 V
Lead Channel Setting Pacing Pulse Width: 0.5 ms
Lead Channel Setting Sensing Sensitivity: 4 mV
Pulse Gen Model: 2272
Pulse Gen Serial Number: 8930546

## 2020-03-28 NOTE — Patient Instructions (Addendum)
Medication Instructions:  NONE *If you need a refill on your cardiac medications before your next appointment, please call your pharmacy*   Lab Work: NONE If you have labs (blood work) drawn today and your tests are completely normal, you will receive your results only by: Marland Kitchen MyChart Message (if you have MyChart) OR . A paper copy in the mail If you have any lab test that is abnormal or we need to change your treatment, we will call you to review the results.   Testing/Procedures: NONE   Follow-Up: At [facility], you and your health needs are our priority.  As part of our continuing mission to provide you with exceptional heart care, we have created designated Provider Care Teams.  These Care Teams include your primary Cardiologist (physician) and Advanced Practice Providers (APPs -  Physician Assistants and Nurse Practitioners) who all work together to provide you with the care you need, when you need it.  We recommend signing up for the patient portal called "MyChart".  Sign up information is provided on this After Visit Summary.  MyChart is used to connect with patients for Virtual Visits (Telemedicine).  Patients are able to view lab/test results, encounter notes, upcoming appointments, etc.  Non-urgent messages can be sent to your provider as well.   To learn more about what you can do with MyChart, go to [email or web address].    Your next appointment:   1 year(s)  The format for your next appointment:   Either In Person or Virtual  Provider:   Dr Elberta Fortis in [place]   Other Instructions Remote monitoring is used to monitor your Pacemaker from home. This monitoring reduces the number of office visits required to check your device to one time per year. It allows [place] to keep an eye on the functioning of your device to ensure it is working properly. You are scheduled for a device check from home on 06/13/20. You may send your transmission at any time that day. If you have a  wireless device, the transmission will be sent automatically. After your physician reviews your transmission, you will receive a postcard with your next transmission date.

## 2020-03-28 NOTE — Telephone Encounter (Signed)
Patient was seen in-clinic by A. Tillery, PA-C on 03/28/20. Plan for f/u in 1 year with Dr. Elberta Fortis in [place].

## 2021-04-02 ENCOUNTER — Encounter: Payer: Medicare Other | Admitting: Cardiology

## 2021-04-27 ENCOUNTER — Ambulatory Visit (INDEPENDENT_AMBULATORY_CARE_PROVIDER_SITE_OTHER): Payer: Medicare Other | Admitting: Cardiology

## 2021-04-27 ENCOUNTER — Encounter: Payer: Self-pay | Admitting: Cardiology

## 2021-04-27 ENCOUNTER — Other Ambulatory Visit: Payer: Self-pay

## 2021-04-27 VITALS — BP 128/68 | HR 60 | Ht 60.0 in | Wt 220.8 lb

## 2021-04-27 DIAGNOSIS — I442 Atrioventricular block, complete: Secondary | ICD-10-CM | POA: Diagnosis not present

## 2021-04-27 DIAGNOSIS — Z950 Presence of cardiac pacemaker: Secondary | ICD-10-CM | POA: Diagnosis not present

## 2021-04-27 NOTE — Patient Instructions (Signed)
Medication Instructions:  Your physician recommends that you continue on your current medications as directed. Please refer to the Current Medication list given to you today.  *If you need a refill on your cardiac medications before your next appointment, please call your pharmacy*   Lab Work: None ordered   Testing/Procedures: None ordered   Follow-Up: At [facility], you and your health needs are our priority.  As part of our continuing mission to provide you with exceptional heart care, we have created designated Provider Care Teams.  These Care Teams include your primary Cardiologist (physician) and Advanced Practice Providers (APPs -  Physician Assistants and Nurse Practitioners) who all work together to provide you with the care you need, when you need it.  Remote monitoring is used to monitor your Pacemaker or ICD from home. This monitoring reduces the number of office visits required to check your device to one time per year. It allows [place] to keep an eye on the functioning of your device to ensure it is working properly. You are scheduled for a device check from home on 06/11/2021. You may send your transmission at any time that day. If you have a wireless device, the transmission will be sent automatically. After your physician reviews your transmission, you will receive a postcard with your next transmission date.  Your next appointment:   1 year(s)  The format for your next appointment:   In Person  Provider:   Loman Brooklyn, MD   Thank you for choosing [facility]!!   Dory Horn, RN [phone]

## 2021-04-27 NOTE — Progress Notes (Signed)
Electrophysiology Office Note   Date:  04/27/2021   ID:  Carmen Gilbert, DOB November 26, 1929, MRN [id number]  PCP:  Galvin Proffer, MD  Cardiologist:   Primary Electrophysiologist:  Tanganyika Bowlds Jorja Loa, MD    Chief Complaint: pacemaker   History of Present Illness: Carmen Gilbert is a 87 y.o. female who is being seen today for the evaluation of pacemaker at the request of Hague, Myrene Galas, MD. Presenting today for electrophysiology evaluation.  She has a history of diabetes, hypertension, complete heart block.  She is status post Printmaker.  Today, she denies symptoms of palpitations, shortness of breath, orthopnea, PND, lower extremity edema, claudication, dizziness, presyncope, syncope, bleeding, or neurologic sequela. The patient is tolerating medications without difficulties.  She does complain of chest pain.  Pain is worse when she is up moving around, but also occurs at rest.  She states that the pain also occurs in both of her arms.  She states that when she presses on her chest, that reproduces the pain.  Palpation of her chest reproduces the exact same pain.  She feels that her arm pain may be also be due to arthritis.   Past Medical History:  Diagnosis Date  . CHF (congestive heart failure) (HCC)   . Dementia (HCC)   . Diabetes mellitus without complication (HCC)   . Hypertension    Past Surgical History:  Procedure Laterality Date  . PACEMAKER IMPLANT N/A 08/02/2017   Procedure: Pacemaker Implant;  Surgeon: Hillis Range, MD;  [facility]: [facility];  Service: Cardiovascular;  Laterality: N/A;  . TEMPORARY PACEMAKER N/A 08/01/2017   Procedure: Temporary Pacemaker;  Surgeon: Lennette Bihari, MD;  Location: [facility];  Service: Cardiovascular;  Laterality: N/A;     Current Outpatient Medications  Medication Sig Dispense Refill  . acetaminophen (TYLENOL) 325 MG tablet Take 650 mg by mouth 4 (four) times daily.    Marland Kitchen aspirin 81 MG chewable tablet  Chew 81 mg by mouth every morning.    Marland Kitchen atorvastatin (LIPITOR) 40 MG tablet Take 40 mg by mouth every evening.    . furosemide (LASIX) 40 MG tablet Take 40 mg by mouth 2 (two) times daily.    Marland Kitchen gabapentin (NEURONTIN) 300 MG capsule Take 300 mg by mouth at bedtime.    Marland Kitchen latanoprost (XALATAN) 0.005 % ophthalmic solution Place 1 drop into both eyes at bedtime.    Marland Kitchen LORazepam (ATIVAN) 1 MG tablet Take 1 mg by mouth 2 (two) times daily.    Marland Kitchen losartan (COZAAR) 50 MG tablet Take 50 mg by mouth 2 (two) times daily.    . metoprolol tartrate (LOPRESSOR) 50 MG tablet Take 50 mg by mouth 2 (two) times daily.    Marland Kitchen oxyCODONE-acetaminophen (PERCOCET/ROXICET) 5-325 MG tablet Take 1 tablet by mouth 2 (two) times daily as needed.    . polyethylene glycol powder (GLYCOLAX/MIRALAX) powder Take 17 g by mouth every morning. MIX INTO 8 OUNCES OF LIQUID (HOLD FOR LOOSE STOOLS)    . senna-docusate (SENOKOT-S) 8.6-50 MG tablet Take 2 tablets by mouth 2 (two) times daily.    . sertraline (ZOLOFT) 50 MG tablet Take 1 tablet by mouth daily.    . traZODone (DESYREL) 50 MG tablet Take 50 mg by mouth at bedtime.    . Vitamin D, Ergocalciferol, (DRISDOL) 50000 units CAPS capsule Take 50,000 Units by mouth every 30 (thirty) days. ON THE 15TH OF EVERY MONTH     No current facility-administered medications for  this visit.    Allergies:   Ms contin [morphine sulfate er]   Social History:  The patient  reports that she has never smoked. She has never used smokeless tobacco. She reports that she does not drink alcohol and does not use drugs.   Family History: Unknown to the patient   ROS:  Please see the history of present illness.   Otherwise, review of systems is positive for none.   All other systems are reviewed and negative.    PHYSICAL EXAM: VS:  BP 128/68   Pulse 60   Ht 5' (1.524 m)   Wt 220 lb 12.8 oz (100.2 kg)   SpO2 98%   BMI 43.12 kg/m  , BMI Body mass index is 43.12 kg/m. GEN: Well nourished, well  developed, in no acute distress  HEENT: normal  Neck: no JVD, carotid bruits, or masses Cardiac: RRR; no murmurs, rubs, or gallops,no edema  Respiratory:  clear to auscultation bilaterally, normal work of breathing GI: soft, nontender, nondistended, + BS MS: no deformity or atrophy  Skin: warm and dry, device pocket is well healed Neuro:  Strength and sensation are intact Psych: euthymic mood, full affect  EKG:  EKG is ordered today. Personal review of the ekg ordered shows AV paced  Device interrogation is reviewed today in detail.  See PaceArt for details.   Recent Labs: No results found for requested labs within last 8760 hours.    Lipid Panel  No results found for: CHOL, TRIG, HDL, CHOLHDL, VLDL, LDLCALC, LDLDIRECT   Wt Readings from Last 3 Encounters:  04/27/21 220 lb 12.8 oz (100.2 kg)  08/03/17 200 lb 2.8 oz (90.8 kg)      Other studies Reviewed: Additional studies/ records that were reviewed today include: TTE 2018 Review of the above records today demonstrates:  - Left ventricle: The cavity size was normal. Wall thickness was  increased in a pattern of mild LVH. There was severe assymetric  focal basal hypertrophy of the septum. Mobile filamentous  structure which may represent thrombus or less partially flail  mitral chord (since there is no significant MR) seen in the LVOT.  Systolic function was normal. The estimated ejection fraction was  in the range of 60% to 65%. Doppler parameters are consistent  with abnormal left ventricular relaxation (grade 1 diastolic  dysfunction). The E/e&' ratio is >15, suggesting elevated LV  filling pressure.  - Aortic valve: Sclerosis without stenosis. There was no  regurgitation.  - Mitral valve: Calcified annulus with valve sclerosis. There was  trivial regurgitation.  - Left atrium: The atrium was normal in size.  - Right atrium: The atrium was normal in size.  - Atrial septum: There was increased  thickness of the septum,  consistent with lipomatous hypertrophy. No defect or patent  foramen ovale was identified.  - Tricuspid valve: There was mild regurgitation.  - Pulmonary arteries: PA peak pressure: 37 mm Hg (S).  - Inferior vena cava: The vessel was normal in size. The  respirophasic diameter changes were in the normal range (>= 50%),  consistent with normal central venous pressure.    ASSESSMENT AND PLAN:  1.  Complete heart block: Status post [facility] Jude dual-chamber pacemaker.  Device functioning appropriately.  No changes at this time.  2.  Hypertension: Currently well controlled  3.  Chest pain: Pain occurs both at rest and with exertion, though she is quite stationary for the vast majority of the time.  Pain is worse with palpation of  the left upper chest.  This is likely noncardiac and no further cardiac work-up at this time.  Current medicines are reviewed at length with the patient today.   The patient does not have concerns regarding her medicines.  The following changes were made today:  none  Labs/ tests ordered today include:  Orders Placed This Encounter  Procedures  . EKG 12-Lead     Disposition:   FU with Baila Rouse 1 year  Signed, Aideen Fenster Jorja Loa, MD  04/27/2021 12:31 PM     [facility] [address] [place] [address] [phone] (office) [phone] (fax)

## 2022-06-28 ENCOUNTER — Ambulatory Visit (INDEPENDENT_AMBULATORY_CARE_PROVIDER_SITE_OTHER): Payer: Commercial Managed Care - HMO | Admitting: Cardiology

## 2022-06-28 ENCOUNTER — Encounter: Payer: Self-pay | Admitting: Cardiology

## 2022-06-28 VITALS — BP 116/68 | HR 60 | Ht 60.0 in

## 2022-06-28 DIAGNOSIS — I442 Atrioventricular block, complete: Secondary | ICD-10-CM

## 2022-06-28 NOTE — Patient Instructions (Signed)
Medication Instructions:  Your physician recommends that you continue on your current medications as directed. Please refer to the Current Medication list given to you today.  *If you need a refill on your cardiac medications before your next appointment, please call your pharmacy*   Lab Work: None ordered   Testing/Procedures: None ordered   Follow-Up: At [facility], you and your health needs are our priority.  As part of our continuing mission to provide you with exceptional heart care, we have created designated Provider Care Teams.  These Care Teams include your primary Cardiologist (physician) and Advanced Practice Providers (APPs -  Physician Assistants and Nurse Practitioners) who all work together to provide you with the care you need, when you need it.   Your next appointment:   6 month(s)  The format for your next appointment:   In Person  Provider:   Device nurse for pacemaker check  Your physician recommends that you schedule a follow-up appointment in: 1 yr with Dr. Elberta Fortis.    Thank you for choosing [facility]!!   Dory Horn, RN [phone]  Other Instructions   Important Information About Sugar

## 2022-06-28 NOTE — Progress Notes (Signed)
Electrophysiology Office Note   Date:  06/28/2022   ID:  Carmen Gilbert, DOB December 20, 1929, MRN [id number]  PCP:  Galvin Proffer, MD  Cardiologist:   Primary Electrophysiologist:  Glady Ouderkirk Jorja Loa, MD    Chief Complaint: pacemaker   History of Present Illness: Carmen Gilbert is a 88 y.o. female who is being seen today for the evaluation of pacemaker at the request of Hague, Myrene Galas, MD. Presenting today for electrophysiology evaluation.  She has a history significant for diabetes, hypertension, complete heart block.  She is status post Printmaker.  Today, denies symptoms of palpitations, chest pain, shortness of breath, orthopnea, PND, lower extremity edema, claudication, dizziness, presyncope, syncope, bleeding, or neurologic sequela. The patient is tolerating medications without difficulties.     Past Medical History:  Diagnosis Date   CHF (congestive heart failure) (HCC)    Dementia (HCC)    Diabetes mellitus without complication (HCC)    Hypertension    Past Surgical History:  Procedure Laterality Date   PACEMAKER IMPLANT N/A 08/02/2017   Procedure: Pacemaker Implant;  Surgeon: Hillis Range, MD;  Location: [facility];  Service: Cardiovascular;  Laterality: N/A;   TEMPORARY PACEMAKER N/A 08/01/2017   Procedure: Temporary Pacemaker;  Surgeon: Lennette Bihari, MD;  Location: [facility] INVASIVE CV LAB;  Service: Cardiovascular;  Laterality: N/A;     Current Outpatient Medications  Medication Sig Dispense Refill   acetaminophen (TYLENOL) 325 MG tablet Take 650 mg by mouth 4 (four) times daily.     amLODipine (NORVASC) 2.5 MG tablet Take 2.5 mg by mouth daily.     furosemide (LASIX) 40 MG tablet Take 40 mg by mouth 2 (two) times daily.     gabapentin (NEURONTIN) 300 MG capsule Take 300 mg by mouth at bedtime.     LANTUS SOLOSTAR 100 UNIT/ML Solostar Pen Inject 10 Units into the skin at bedtime.     latanoprost (XALATAN) 0.005 % ophthalmic solution Place 1  drop into both eyes at bedtime.     LORazepam (ATIVAN) 1 MG tablet Take 1 mg by mouth 2 (two) times daily.     losartan (COZAAR) 50 MG tablet Take 50 mg by mouth 2 (two) times daily.     metoprolol tartrate (LOPRESSOR) 50 MG tablet Take 50 mg by mouth 2 (two) times daily.     oxyCODONE-acetaminophen (PERCOCET/ROXICET) 5-325 MG tablet Take 1 tablet by mouth 2 (two) times daily as needed.     polyethylene glycol powder (GLYCOLAX/MIRALAX) powder Take 17 g by mouth every morning. MIX INTO 8 OUNCES OF LIQUID (HOLD FOR LOOSE STOOLS)     rosuvastatin (CRESTOR) 10 MG tablet Take 10 mg by mouth at bedtime.     senna-docusate (SENOKOT-S) 8.6-50 MG tablet Take 2 tablets by mouth 2 (two) times daily.     sertraline (ZOLOFT) 50 MG tablet Take 1 tablet by mouth daily.     traZODone (DESYREL) 50 MG tablet Take 50 mg by mouth at bedtime.     Vitamin D, Ergocalciferol, (DRISDOL) 50000 units CAPS capsule Take 50,000 Units by mouth every 30 (thirty) days. ON THE 15TH OF EVERY MONTH     aspirin 81 MG chewable tablet Chew 81 mg by mouth every morning. (Patient not taking: Reported on 06/28/2022)     No current facility-administered medications for this visit.    Allergies:   Ms contin [morphine sulfate er]   Social History:  The patient  reports that she has never smoked. She has never  used smokeless tobacco. She reports that she does not drink alcohol and does not use drugs.   Family History: Unknown to the patient  ROS:  Please see the history of present illness.   Otherwise, review of systems is positive for none.   All other systems are reviewed and negative.   PHYSICAL EXAM: VS:  BP 116/68   Pulse 60   Ht 5' (1.524 m)   SpO2 98%   BMI 43.12 kg/m  , BMI Body mass index is 43.12 kg/m. GEN: Well nourished, well developed, in no acute distress  HEENT: normal  Neck: no JVD, carotid bruits, or masses Cardiac: RRR; no murmurs, rubs, or gallops,no edema  Respiratory:  clear to auscultation bilaterally,  normal work of breathing GI: soft, nontender, nondistended, + BS MS: no deformity or atrophy  Skin: warm and dry, device site well healed Neuro:  Strength and sensation are intact Psych: euthymic mood, full affect  EKG:  EKG is ordered today. Personal review of the ekg ordered shows AV paced  Personal review of the device interrogation today. Results in Paceart    Recent Labs: No results found for requested labs within last 365 days.    Lipid Panel  No results found for: "CHOL", "TRIG", "HDL", "CHOLHDL", "VLDL", "LDLCALC", "LDLDIRECT"   Wt Readings from Last 3 Encounters:  04/27/21 220 lb 12.8 oz (100.2 kg)  08/03/17 200 lb 2.8 oz (90.8 kg)      Other studies Reviewed: Additional studies/ records that were reviewed today include: TTE 2018 Review of the above records today demonstrates:  - Left ventricle: The cavity size was normal. Wall thickness was    increased in a pattern of mild LVH. There was severe assymetric    focal basal hypertrophy of the septum. Mobile filamentous    structure which may represent thrombus or less partially flail    mitral chord (since there is no significant MR) seen in the LVOT.    Systolic function was normal. The estimated ejection fraction was    in the range of 60% to 65%. Doppler parameters are consistent    with abnormal left ventricular relaxation (grade 1 diastolic    dysfunction). The E/e&' ratio is >15, suggesting elevated LV    filling pressure.  - Aortic valve: Sclerosis without stenosis. There was no    regurgitation.  - Mitral valve: Calcified annulus with valve sclerosis. There was    trivial regurgitation.  - Left atrium: The atrium was normal in size.  - Right atrium: The atrium was normal in size.  - Atrial septum: There was increased thickness of the septum,    consistent with lipomatous hypertrophy. No defect or patent    foramen ovale was identified.  - Tricuspid valve: There was mild regurgitation.  - Pulmonary  arteries: PA peak pressure: 37 mm Hg (S).  - Inferior vena cava: The vessel was normal in size. The    respirophasic diameter changes were in the normal range (>= 50%),    consistent with normal central venous pressure.    ASSESSMENT AND PLAN:  1.  Complete heart block: Status post [facility] Jude dual-chamber pacemaker.  Device function appropriately.  No changes at this time.  2.  Hypertension: well controlled   Current medicines are reviewed at length with the patient today.   The patient does not have concerns regarding her medicines.  The following changes were made today:    Labs/ tests ordered today include:  Orders Placed This Encounter  Procedures  EKG 12-Lead     Disposition:   FU with Alontae Chaloux 1 year  Signed, Tashena Ibach Jorja Loa, MD  06/28/2022 9:58 AM     [facility] [address] [place] [address] [phone] (office) [phone] (fax)

## 2023-01-31 ENCOUNTER — Ambulatory Visit: Payer: Commercial Managed Care - HMO | Attending: Cardiology

## 2023-01-31 DIAGNOSIS — I442 Atrioventricular block, complete: Secondary | ICD-10-CM | POA: Diagnosis not present

## 2023-01-31 LAB — CUP PACEART INCLINIC DEVICE CHECK
Battery Remaining Longevity: 42 mo
Battery Voltage: 2.96 V
Brady Statistic RA Percent Paced: 90 %
Brady Statistic RV Percent Paced: 99.85 %
Date Time Interrogation Session: 20240205164150
Implantable Lead Connection Status: 753985
Implantable Lead Connection Status: 753985
Implantable Lead Implant Date: 20180807
Implantable Lead Implant Date: 20180807
Implantable Lead Location: 753859
Implantable Lead Location: 753860
Implantable Pulse Generator Implant Date: 20180807
Lead Channel Impedance Value: 387.5 Ohm
Lead Channel Impedance Value: 575 Ohm
Lead Channel Pacing Threshold Amplitude: 0.5 V
Lead Channel Pacing Threshold Amplitude: 0.5 V
Lead Channel Pacing Threshold Amplitude: 0.75 V
Lead Channel Pacing Threshold Amplitude: 0.75 V
Lead Channel Pacing Threshold Pulse Width: 0.5 ms
Lead Channel Pacing Threshold Pulse Width: 0.5 ms
Lead Channel Pacing Threshold Pulse Width: 0.5 ms
Lead Channel Pacing Threshold Pulse Width: 0.5 ms
Lead Channel Sensing Intrinsic Amplitude: 2.8 mV
Lead Channel Setting Pacing Amplitude: 0.875
Lead Channel Setting Pacing Amplitude: 2 V
Lead Channel Setting Pacing Pulse Width: 0.5 ms
Lead Channel Setting Sensing Sensitivity: 4 mV
Pulse Gen Model: 2272
Pulse Gen Serial Number: 8930546

## 2023-01-31 NOTE — Patient Instructions (Addendum)
Medication Instructions:  Your physician recommends that you continue on your current medications as directed. Please refer to the Current Medication list given to you today.  Labwork: None ordered.  Testing/Procedures: None ordered.  Follow-Up: Your physician wants you to follow-up in: June 13, 2023 at 12:00 pm with Dr. Curt Bears at the [facility] office.

## 2023-01-31 NOTE — Progress Notes (Unsigned)
Pacemaker check in clinic. Normal device function. Thresholds, sensing, impedances consistent with previous measurements. Device programmed to maximize longevity. No mode switch or high ventricular rates noted. Device programmed at appropriate safety margins. Histogram distribution appropriate for patient activity level. Device programmed to optimize intrinsic conduction. Estimated longevity 3.4 years. Pt scheduled for June follow up with Dr. Curt Bears.  Pt does not do remotes. Patient education completed.

## 2023-06-13 ENCOUNTER — Ambulatory Visit: Payer: Medicare Other | Admitting: Cardiology

## 2023-06-28 ENCOUNTER — Telehealth: Payer: Self-pay | Admitting: General Practice

## 2023-06-28 NOTE — Telephone Encounter (Signed)
Called to r/s last cancelled/no show visit/ was transferred to transportation at facility but could not leave a vm/kbl 06/28/23

## 2023-06-28 NOTE — Telephone Encounter (Signed)
-----   Message from Baird Lyons, RN sent at 06/13/2023 12:37 PM EDT ----- Regarding: r/s PPM office visit w/ [facility] Pt No Showed today Needs to be r/s w/ [facility] Has PPM  thx

## 2023-07-01 NOTE — Telephone Encounter (Signed)
Called to r/s last cancelled/no show visit/ was transferred to transportation at facility but could not leave a vm/kbl 07/01/23

## 2023-07-01 NOTE — Telephone Encounter (Signed)
-----   Message from Sherri L Price, RN sent at 06/13/2023 12:37 PM EDT ----- Regarding: r/s PPM office visit w/ [facility] Pt No Showed today Needs to be r/s w/ [facility] Has PPM  thx  

## 2023-07-26 NOTE — Progress Notes (Addendum)
  Electrophysiology Office Note:   Date:  07/28/2023  ID:  Carmen Gilbert, DOB 1929/11/19, MRN [id number]  Primary Cardiologist: None Electrophysiologist: Will Jorja Loa, MD      History of Present Illness:   Carmen Gilbert is a 89 y.o. female with h/o dementia, DM, HTN and CHB s/p PPM seen for routine electrophysiology followup.   Most recent device check 01/2023 showed battery & leads stable.  She does not do remote checks.  Last seen in EP Clinic 06/2022 per Dr. Elberta Fortis - at that time was the pt was stable, no changes to plan of care.   Since last being seen in our clinic the staff member Lucio Edward) from [facility] reports the patient is unchanged. She is not aware of issues with her pacemaker at the facility.  The patient is dependent of all ADL's, bed to chair with hoyer lift. No reports of obvious pain or discomfort per staff.    Review of systems complete and found to be negative unless listed in HPI.    EP Information / Studies Reviewed:    EKG is ordered today. Personal review as below.  EKG Interpretation Date/Time:  Thursday July 28 2023 09:05:12 EDT Ventricular Rate:  60 PR Interval:  188 QRS Duration:  178 QT Interval:  472 QTC Calculation: 472 R Axis:   128  Text Interpretation: AV dual-paced rhythm When compared with ECG of 03-Aug-2017 06:38, Vent. rate has decreased BY   8 BPM Confirmed by Canary Brim ([id number]) on 07/28/2023 9:13:30 AM   PPM Interrogation-  reviewed in detail today,  See PACEART report.  Device History: Abbott Dual Chamber PPM implanted 08/02/17 for CHB   Studies:  ECHO 2018 > LVEF 60-65%, mild LVH, severe asymmetric focal basal hypertrophy of the septum, mobile filamentous structure which may represent thrombus or less partially flail mitral chord (since there is no significant MR) seen in the LVOT, AV sclerosis w/o stenosis, trivial MVR, LA/RA normal size, mild TVR.   Risk Assessment/Calculations:               Physical Exam:   VS:  BP 112/60   Pulse 60   Ht 5' (1.524 m)   Wt 220 lb (99.8 kg)   SpO2 98%   BMI 42.97 kg/m    Wt Readings from Last 3 Encounters:  07/28/23 220 lb (99.8 kg)  04/27/21 220 lb 12.8 oz (100.2 kg)  08/03/17 200 lb 2.8 oz (90.8 kg)     GEN: chronically ill appearing elderly female sitting in chair, moaning. Mask in place / spits NECK: No JVD; No carotid bruits CARDIAC: regular, difficult to auscultate heart tones due to pt moaning  RESPIRATORY:  Clear to auscultation without rales, wheezing or rhonchi  ABDOMEN: Soft, non-tender, non-distended EXTREMITIES:  trace ankle edema; No deformity   ASSESSMENT AND PLAN:    CHB s/p Abbott PPM  -Normal PPM function -See Pace Art report -No changes today  HTN -well controlled   Dementia -per primary team at [facility]   Disposition:   Follow up with Dr. Elberta Fortis or EP APP  6 months.   Pt does not do remote checks. Although, staff states she has a monitor at the facility. Report of visit sent back to facility.   Signed, Canary Brim, MSN, APRN, NP-C, AGACNP-BC [facility] - Electrophysiology  07/28/2023, 9:46 AM

## 2023-07-28 ENCOUNTER — Ambulatory Visit: Payer: Medicare Other | Attending: Cardiology | Admitting: Pulmonary Disease

## 2023-07-28 ENCOUNTER — Encounter: Payer: Self-pay | Admitting: Pulmonary Disease

## 2023-07-28 VITALS — BP 112/60 | HR 60 | Ht 60.0 in | Wt 220.0 lb

## 2023-07-28 DIAGNOSIS — I442 Atrioventricular block, complete: Secondary | ICD-10-CM

## 2023-07-28 DIAGNOSIS — I10 Essential (primary) hypertension: Secondary | ICD-10-CM | POA: Diagnosis not present

## 2023-07-28 LAB — CUP PACEART INCLINIC DEVICE CHECK
Battery Remaining Longevity: 36 mo
Battery Voltage: 2.95 V
Brady Statistic RA Percent Paced: 95 %
Brady Statistic RV Percent Paced: 99.92 %
Date Time Interrogation Session: 20240801120616
Implantable Lead Connection Status: 753985
Implantable Lead Connection Status: 753985
Implantable Lead Implant Date: 20180807
Implantable Lead Implant Date: 20180807
Implantable Lead Location: 753859
Implantable Lead Location: 753860
Implantable Pulse Generator Implant Date: 20180807
Lead Channel Impedance Value: 387.5 Ohm
Lead Channel Impedance Value: 575 Ohm
Lead Channel Pacing Threshold Amplitude: 0.5 V
Lead Channel Pacing Threshold Amplitude: 1 V
Lead Channel Pacing Threshold Amplitude: 1 V
Lead Channel Pacing Threshold Pulse Width: 0.5 ms
Lead Channel Pacing Threshold Pulse Width: 0.5 ms
Lead Channel Pacing Threshold Pulse Width: 0.5 ms
Lead Channel Sensing Intrinsic Amplitude: 2.6 mV
Lead Channel Sensing Intrinsic Amplitude: 7.7 mV
Lead Channel Setting Pacing Amplitude: 0.75 V
Lead Channel Setting Pacing Amplitude: 2 V
Lead Channel Setting Pacing Pulse Width: 0.5 ms
Lead Channel Setting Sensing Sensitivity: 4 mV
Pulse Gen Model: 2272
Pulse Gen Serial Number: 8930546

## 2023-07-28 NOTE — Patient Instructions (Signed)
Medication Instructions:   Your physician recommends that you continue on your current medications as directed. Please refer to the Current Medication list given to you today.  *If you need a refill on your cardiac medications before your next appointment, please call your pharmacy*   Lab Work:  NONE ORDERED  TODAY   If you have labs (blood work) drawn today and your tests are completely normal, you will receive your results only by: MyChart Message (if you have MyChart) OR A paper copy in the mail If you have any lab test that is abnormal or we need to change your treatment, we will call you to review the results.   Testing/Procedures: NONE ORDERED  TODAY   Up: At [facility], you and your health needs are our priority.  As part of our continuing mission to provide you with exceptional heart care, we have created designated Provider Care Teams.  These Care Teams include your primary Cardiologist (physician) and Advanced Practice Providers (APPs -  Physician Assistants and Nurse Practitioners) who all work together to provide you with the care you need, when you need it.  We recommend signing up for the patient portal called "MyChart".  Sign up information is provided on this After Visit Summary.  MyChart is used to connect with patients for Virtual Visits (Telemedicine).  Patients are able to view lab/test results, encounter notes, upcoming appointments, etc.  Non-urgent messages can be sent to your provider as well.   To learn more about what you can do with MyChart, go to [email or web address].    Your next appointment:   6 month(s)  Provider:   Loman Brooklyn, MD, Doreatha Martin, PA-C, or Francis Dowse, [place]   Other Instructions

## 2024-06-26 DEATH — deceased
